# Patient Record
Sex: Female | Born: 1950 | Race: White | Hispanic: No | Smoking: Former smoker
Health system: Southern US, Community
[De-identification: ages and names within clinical notes are randomized; demographics above are authoritative.]

## PROBLEM LIST (undated history)

## (undated) DIAGNOSIS — E669 Obesity, unspecified: Secondary | ICD-10-CM

## (undated) DIAGNOSIS — T7840XA Allergy, unspecified, initial encounter: Secondary | ICD-10-CM

## (undated) DIAGNOSIS — C4490 Unspecified malignant neoplasm of skin, unspecified: Secondary | ICD-10-CM

## (undated) DIAGNOSIS — E871 Hypo-osmolality and hyponatremia: Secondary | ICD-10-CM

## (undated) DIAGNOSIS — J45909 Unspecified asthma, uncomplicated: Secondary | ICD-10-CM

## (undated) DIAGNOSIS — C50919 Malignant neoplasm of unspecified site of unspecified female breast: Secondary | ICD-10-CM

## (undated) DIAGNOSIS — J449 Chronic obstructive pulmonary disease, unspecified: Secondary | ICD-10-CM

## (undated) DIAGNOSIS — E876 Hypokalemia: Secondary | ICD-10-CM

## (undated) DIAGNOSIS — Z923 Personal history of irradiation: Secondary | ICD-10-CM

## (undated) DIAGNOSIS — J9601 Acute respiratory failure with hypoxia: Secondary | ICD-10-CM

## (undated) HISTORY — PX: MOHS SURGERY: SUR867

## (undated) HISTORY — DX: Allergy, unspecified, initial encounter: T78.40XA

## (undated) HISTORY — DX: Personal history of irradiation: Z92.3

---

## 2002-04-09 ENCOUNTER — Ambulatory Visit (HOSPITAL_COMMUNITY): Admission: RE | Admit: 2002-04-09 | Discharge: 2002-04-09 | Payer: Self-pay | Admitting: *Deleted

## 2002-04-09 ENCOUNTER — Encounter: Payer: Self-pay | Admitting: *Deleted

## 2002-05-05 ENCOUNTER — Ambulatory Visit (HOSPITAL_COMMUNITY): Admission: RE | Admit: 2002-05-05 | Discharge: 2002-05-05 | Payer: Self-pay | Admitting: Orthopaedic Surgery

## 2002-05-05 ENCOUNTER — Encounter: Payer: Self-pay | Admitting: Orthopaedic Surgery

## 2004-03-14 ENCOUNTER — Ambulatory Visit: Payer: Self-pay | Admitting: *Deleted

## 2004-04-17 ENCOUNTER — Emergency Department: Payer: Self-pay | Admitting: Emergency Medicine

## 2006-03-29 ENCOUNTER — Emergency Department: Payer: Self-pay | Admitting: Internal Medicine

## 2007-11-17 ENCOUNTER — Inpatient Hospital Stay: Payer: Self-pay | Admitting: *Deleted

## 2007-11-26 ENCOUNTER — Emergency Department: Payer: Self-pay | Admitting: Emergency Medicine

## 2008-04-07 ENCOUNTER — Ambulatory Visit: Payer: Self-pay

## 2013-02-05 DIAGNOSIS — C50919 Malignant neoplasm of unspecified site of unspecified female breast: Secondary | ICD-10-CM

## 2013-02-05 HISTORY — PX: BREAST EXCISIONAL BIOPSY: SUR124

## 2013-02-05 HISTORY — DX: Malignant neoplasm of unspecified site of unspecified female breast: C50.919

## 2013-02-05 HISTORY — PX: BREAST BIOPSY: SHX20

## 2013-07-15 ENCOUNTER — Ambulatory Visit: Payer: Self-pay

## 2013-07-17 ENCOUNTER — Ambulatory Visit: Payer: Self-pay

## 2013-07-20 ENCOUNTER — Ambulatory Visit: Payer: Self-pay

## 2013-07-22 LAB — PATHOLOGY REPORT

## 2013-08-13 ENCOUNTER — Ambulatory Visit: Payer: Self-pay | Admitting: Surgery

## 2013-08-13 LAB — CBC
HCT: 40.5 % (ref 35.0–47.0)
HGB: 13 g/dL (ref 12.0–16.0)
MCH: 28.6 pg (ref 26.0–34.0)
MCHC: 32.2 g/dL (ref 32.0–36.0)
MCV: 89 fL (ref 80–100)
Platelet: 263 10*3/uL (ref 150–440)
RBC: 4.55 10*6/uL (ref 3.80–5.20)
RDW: 13.6 % (ref 11.5–14.5)
WBC: 8.5 10*3/uL (ref 3.6–11.0)

## 2013-08-21 ENCOUNTER — Ambulatory Visit: Payer: Self-pay | Admitting: Surgery

## 2013-08-21 HISTORY — PX: BREAST LUMPECTOMY: SHX2

## 2013-08-26 LAB — PATHOLOGY REPORT

## 2013-09-08 ENCOUNTER — Ambulatory Visit: Payer: Self-pay | Admitting: Oncology

## 2013-09-23 ENCOUNTER — Other Ambulatory Visit: Payer: Self-pay | Admitting: Oncology

## 2013-09-23 DIAGNOSIS — D0502 Lobular carcinoma in situ of left breast: Secondary | ICD-10-CM

## 2013-10-01 ENCOUNTER — Ambulatory Visit
Admission: RE | Admit: 2013-10-01 | Discharge: 2013-10-01 | Disposition: A | Discharge status: Home / Self Care | Payer: Medicaid Other | Source: Ambulatory Visit | Attending: Oncology | Admitting: Oncology

## 2013-10-01 DIAGNOSIS — D0502 Lobular carcinoma in situ of left breast: Secondary | ICD-10-CM

## 2013-10-01 MED ORDER — GADOBENATE DIMEGLUMINE 529 MG/ML IV SOLN
19.0000 mL | Freq: Once | INTRAVENOUS | Status: AC | PRN
  Administered 2013-10-01: 19 mL via INTRAVENOUS

## 2013-10-06 ENCOUNTER — Ambulatory Visit: Payer: Self-pay | Admitting: Oncology

## 2013-11-05 ENCOUNTER — Ambulatory Visit: Payer: Self-pay | Admitting: Oncology

## 2014-05-29 NOTE — Op Note (Signed)
PATIENT NAME:  Marie Fernandez, Marie Fernandez MR#:  [id number] DATE OF BIRTH:  28-Mar-1950  DATE OF PROCEDURE:  08/21/2013  PREOPERATIVE DIAGNOSIS: Left breast mass.   POSTOPERATIVE DIAGNOSIS: Left breast mass.   PROCEDURE: Excision of left breast mass.   SURGEON: Rochel Brome, M.D.   ANESTHESIA: General.   INDICATIONS: This 64 year old female recently had mammogram depicting a cluster of microcalcifications in the lateral aspect left breast. 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 Preoperative x-ray, needle localization and excision of breast mass was recommended for further evaluation and treatment.   DESCRIPTION OF PROCEDURE: The patient did have a preoperative insertion of Kopans wire. These mammogram images were reviewed, demonstrating location of the wire in the lower outer quadrant in the very close proximity of the wire to the biopsy marker and mammogram findings of mass at that site.   The patient was placed on the operating table in the supine position under general anesthesia. The dressing was removed from the left breast exposing the Kopans wire, which entered the breast in the peripheral aspect 8 o'clock position left breast. The wire was cut 2 cm from the skin. The breast was prepared with ChloraPrep and draped in a sterile manner. A curvilinear incision was made from approximately 2 o'clock to 4:30 position of the left breast, carried down through subcutaneous tissues and encountered the wire and dissected down to the beginning of the thick portion of the wire. At this point, grasped some glandular tissue with an Allis clamp and began to widen the dissection and removed a sample of tissue around the thick portion  extending down to the barb. This portion of tissue was approximately 3 x 3 x 3 cm in dimension and was sent with a wire for specimen mammogram and for routine pathology. The wound was  inspected. It is noted that electrocautery was used during the course of the procedure for hemostasis, and it now appeared that the hemostasis was intact. Some of the deeper tissues were infiltrated with 0.5% Sensorcaine with epinephrine and also subcutaneous tissues were infiltrated as well. The  wound was closed with a running 4-0 Monocryl subcuticular suture.   The radiologist did call to indicate that the biopsy marker was seen within the specimen, and the tissues were sent on to the lab for routine pathology. The wound was treated with Dermabond. The patient was prepared for transfer to the recovery room.    ____________________________ Lenna Sciara. Rochel Brome, MD jws:ds D: 08/21/2013 10:50:13 ET T: 08/21/2013 15:13:46 ET JOB#: [id number]  cc: Loreli Dollar, MD, <Dictator> Loreli Dollar MD ELECTRONICALLY SIGNED 08/23/2013 20:58

## 2014-11-29 ENCOUNTER — Other Ambulatory Visit: Payer: Self-pay | Admitting: Preventative Medicine

## 2014-11-29 DIAGNOSIS — Z1239 Encounter for other screening for malignant neoplasm of breast: Secondary | ICD-10-CM

## 2014-11-29 DIAGNOSIS — Z853 Personal history of malignant neoplasm of breast: Secondary | ICD-10-CM

## 2014-12-06 ENCOUNTER — Other Ambulatory Visit: Payer: Self-pay | Admitting: Preventative Medicine

## 2014-12-06 DIAGNOSIS — Z853 Personal history of malignant neoplasm of breast: Secondary | ICD-10-CM

## 2014-12-06 DIAGNOSIS — Z1239 Encounter for other screening for malignant neoplasm of breast: Secondary | ICD-10-CM

## 2014-12-24 ENCOUNTER — Other Ambulatory Visit: Payer: Medicaid Other

## 2014-12-24 ENCOUNTER — Ambulatory Visit: Payer: Medicaid Other | Attending: Preventative Medicine

## 2015-01-13 ENCOUNTER — Telehealth: Payer: Self-pay | Admitting: *Deleted

## 2015-01-13 NOTE — Telephone Encounter (Signed)
Received recertification notice from DSS for patient's Medicaid.  Called and spoke with patient.  States she is not taking the antihormonal pills anymore.  States they made her feel bad.  She also has not had a mammogram in over a year.  States she missed her appointment.  Encouraged her to reschedule and transferred her call to the breast center for an appointment.  Informed her I could not renew her Medicaid since she was not taking her pills, and that we could get her back into our [facility] program next year for her next mammogram.  She is agreeable.

## 2015-01-27 ENCOUNTER — Ambulatory Visit
Admission: RE | Admit: 2015-01-27 | Discharge: 2015-01-27 | Disposition: A | Discharge status: Home / Self Care | Payer: Medicaid Other | Source: Ambulatory Visit | Attending: Preventative Medicine | Admitting: Preventative Medicine

## 2015-01-27 ENCOUNTER — Other Ambulatory Visit: Payer: Self-pay | Admitting: Preventative Medicine

## 2015-01-27 DIAGNOSIS — Z853 Personal history of malignant neoplasm of breast: Secondary | ICD-10-CM | POA: Insufficient documentation

## 2015-01-27 DIAGNOSIS — Z1239 Encounter for other screening for malignant neoplasm of breast: Secondary | ICD-10-CM

## 2015-01-27 DIAGNOSIS — Z9889 Other specified postprocedural states: Secondary | ICD-10-CM | POA: Diagnosis not present

## 2015-01-27 HISTORY — DX: Unspecified malignant neoplasm of skin, unspecified: C44.90

## 2015-01-27 HISTORY — DX: Malignant neoplasm of unspecified site of unspecified female breast: C50.919

## 2015-02-14 ENCOUNTER — Ambulatory Visit: Payer: Medicaid Other | Admitting: Cardiovascular Disease

## 2015-04-04 ENCOUNTER — Ambulatory Visit: Payer: Medicaid Other | Admitting: Cardiovascular Disease

## 2016-11-29 ENCOUNTER — Other Ambulatory Visit: Payer: Self-pay | Admitting: Family Medicine

## 2016-11-29 DIAGNOSIS — Z13820 Encounter for screening for osteoporosis: Secondary | ICD-10-CM

## 2016-11-30 ENCOUNTER — Other Ambulatory Visit: Payer: Self-pay | Admitting: Family Medicine

## 2016-11-30 DIAGNOSIS — Z1239 Encounter for other screening for malignant neoplasm of breast: Secondary | ICD-10-CM

## 2016-11-30 DIAGNOSIS — R42 Dizziness and giddiness: Secondary | ICD-10-CM

## 2016-11-30 DIAGNOSIS — Z853 Personal history of malignant neoplasm of breast: Secondary | ICD-10-CM

## 2016-12-06 ENCOUNTER — Ambulatory Visit
Admission: RE | Admit: 2016-12-06 | Discharge: 2016-12-06 | Disposition: A | Discharge status: Home / Self Care | Payer: Medicare HMO | Source: Ambulatory Visit | Attending: Family Medicine | Admitting: Family Medicine

## 2016-12-06 DIAGNOSIS — M85851 Other specified disorders of bone density and structure, right thigh: Secondary | ICD-10-CM | POA: Insufficient documentation

## 2016-12-06 DIAGNOSIS — M8588 Other specified disorders of bone density and structure, other site: Secondary | ICD-10-CM | POA: Insufficient documentation

## 2016-12-06 DIAGNOSIS — Z13820 Encounter for screening for osteoporosis: Secondary | ICD-10-CM

## 2017-01-31 ENCOUNTER — Encounter: Payer: Self-pay | Admitting: Emergency Medicine

## 2017-01-31 ENCOUNTER — Emergency Department
Admission: EM | Admit: 2017-01-31 | Discharge: 2017-01-31 | Disposition: A | Discharge status: Home / Self Care | Payer: Medicare HMO | Attending: Student in an Organized Health Care Education/Training Program | Admitting: Student in an Organized Health Care Education/Training Program

## 2017-01-31 ENCOUNTER — Emergency Department: Payer: Medicare HMO

## 2017-01-31 DIAGNOSIS — Y999 Unspecified external cause status: Secondary | ICD-10-CM | POA: Diagnosis not present

## 2017-01-31 DIAGNOSIS — Z853 Personal history of malignant neoplasm of breast: Secondary | ICD-10-CM | POA: Diagnosis not present

## 2017-01-31 DIAGNOSIS — J449 Chronic obstructive pulmonary disease, unspecified: Secondary | ICD-10-CM | POA: Diagnosis not present

## 2017-01-31 DIAGNOSIS — Y929 Unspecified place or not applicable: Secondary | ICD-10-CM | POA: Diagnosis not present

## 2017-01-31 DIAGNOSIS — J45909 Unspecified asthma, uncomplicated: Secondary | ICD-10-CM | POA: Diagnosis not present

## 2017-01-31 DIAGNOSIS — X58XXXA Exposure to other specified factors, initial encounter: Secondary | ICD-10-CM | POA: Diagnosis not present

## 2017-01-31 DIAGNOSIS — S8992XA Unspecified injury of left lower leg, initial encounter: Secondary | ICD-10-CM | POA: Diagnosis present

## 2017-01-31 DIAGNOSIS — Y939 Activity, unspecified: Secondary | ICD-10-CM | POA: Diagnosis not present

## 2017-01-31 DIAGNOSIS — S86912A Strain of unspecified muscle(s) and tendon(s) at lower leg level, left leg, initial encounter: Secondary | ICD-10-CM

## 2017-01-31 DIAGNOSIS — Z87891 Personal history of nicotine dependence: Secondary | ICD-10-CM | POA: Insufficient documentation

## 2017-01-31 DIAGNOSIS — M25562 Pain in left knee: Secondary | ICD-10-CM

## 2017-01-31 DIAGNOSIS — Z85828 Personal history of other malignant neoplasm of skin: Secondary | ICD-10-CM | POA: Insufficient documentation

## 2017-01-31 HISTORY — DX: Chronic obstructive pulmonary disease, unspecified: J44.9

## 2017-01-31 HISTORY — DX: Unspecified asthma, uncomplicated: J45.909

## 2017-01-31 MED ORDER — HYDROCODONE-ACETAMINOPHEN 5-325 MG PO TABS
1.0000 | ORAL_TABLET | Freq: Once | ORAL | Status: AC
Start: 2017-01-31 — End: 2017-01-31
  Administered 2017-01-31: 1 via ORAL
  Filled 2017-01-31: qty 1

## 2017-01-31 MED ORDER — NABUMETONE 750 MG PO TABS: 750.0000 mg | ORAL_TABLET | Freq: Two times a day (BID) | ORAL | 0 refills | Status: DC

## 2017-01-31 NOTE — ED Triage Notes (Signed)
Pt c/o left posterior knee pain x1 week, has been seen by PCP and told to take ibuprofen for pain, no x-rays done. Pt denies injury to area. Pt reports she heard a "pop" today and has been unable to stand on affected leg without pain. Swelling noted to area.

## 2017-01-31 NOTE — Discharge Instructions (Signed)
Your exam and x-ray are consistent with a knee sprain. Your x-ray is essentially normal. Take the prescription anti-inflammatory as directed. Wear the knee brace for support while walking. Follow-up with Dr. Sabra Heck or your provider for continued symptoms.

## 2017-01-31 NOTE — ED Provider Notes (Signed)
[facility] Emergency Department Provider Note ____________________________________________  Time seen: 2235  I have reviewed the triage vital signs and the nursing notes.  HISTORY  Chief Complaint  Knee Pain  HPI Marie Fernandez is a 66 y.o. female resents to the ED accompanied by her family for evaluation of posterior left knee pain for the last week.  Patient was apparently evaluated by the PCP and mentioned the pain.  She was advised to dose ibuprofen for pain.  She denies any recent injury, accident, or trauma.  Patient describes she heard a "pop"to the posterior knee while walking with the assitance of crutches she had at home. She has had increasing disability since that time. She reports difficulty extending the knee and bearing weight.   Past Medical History:  Diagnosis Date  . Asthma   . Breast cancer ([place]) 2015   left breast cancer  . COPD (chronic obstructive pulmonary disease) ([place])   . Skin cancer    squammous cell carcinoma    There are no active problems to display for this patient.   Past Surgical History:  Procedure Laterality Date  . BREAST BIOPSY Left 2015   stereo biopsy  . BREAST LUMPECTOMY Left 08/21/2013    Prior to Admission medications   Medication Sig Start Date End Date Taking? Authorizing Provider  nabumetone (RELAFEN) 750 MG tablet Take 1 tablet (750 mg total) by mouth 2 (two) times daily. 01/31/17   Ardis Lawley, Dannielle Karvonen, PA-C   Allergies Patient has no known allergies.  Family History  Problem Relation Age of Onset  . Breast cancer Cousin   . Breast cancer Maternal Grandmother     Social History Social History   Tobacco Use  . Smoking status: Former Research scientist (life sciences)  . Smokeless tobacco: Never Used  Substance Use Topics  . Alcohol use: Not on file  . Drug use: Not on file    Review of Systems  Constitutional: Negative for fever. Cardiovascular: Negative for chest pain. Respiratory: Negative for shortness of  breath. Musculoskeletal: Negative for back pain.  Posterior left knee pain as above. Skin: Negative for rash. Neurological: Negative for headaches, focal weakness or numbness. ____________________________________________  PHYSICAL EXAM:  VITAL SIGNS: ED Triage Vitals  Enc Vitals Group     BP 01/31/17 2217 (!) 167/77     Pulse Rate 01/31/17 2217 86     Resp 01/31/17 2217 18     Temp 01/31/17 2217 98 F (36.7 C)     Temp Source 01/31/17 2217 Oral     SpO2 01/31/17 2217 97 %     Weight 01/31/17 2217 230 lb (104.3 kg)     Height 01/31/17 2217 5\' 3"  (1.6 m)     Head Circumference --      Peak Flow --      Pain Score 01/31/17 2231 10     Pain Loc --      Pain Edu? --      Excl. in [place]? --     Constitutional: Alert and oriented. Well appearing and in no distress. Head: Normocephalic and atraumatic. Cardiovascular: Normal rate, regular rhythm. Normal distal pulses. Respiratory: Normal respiratory effort. No wheezes/rales/rhonchi. Gastrointestinal: Soft and nontender. No distention. Musculoskeletal: Acne without any obvious deformity, dislocation, or effusion.  Patient with normal patellar tracking without ballottement.  No significant valgus or varus joint stress is appreciated.  She also has some tenderness to palpation along the IT band left thigh.  She localizes some pain to the posterior proximal  As well as the distal calf.  She also notes some pain tenderness to the inferior patellar tendon.  No anterior/posterior drawer.  No popliteal space fullness is appreciated.  Negative Lachman's and McMurray's on exam.  Nontender with normal range of motion in all extremities.  Neurologic:  Normal gait without ataxia. Normal speech and language. No gross focal neurologic deficits are appreciated. Skin:  Skin is warm, dry and intact. No rash noted. Psychiatric: Mood and affect are normal. Patient exhibits appropriate insight and judgment. ____________________________________________    RADIOLOGY  Left Knee  IMPRESSION: No evidence of fracture or dislocation.  I, Amoni Morales, Dannielle Karvonen, personally viewed and evaluated these images (plain radiographs) as part of my medical decision making, as well as reviewing the written report by the radiologist. ____________________________________________  PROCEDURES  .Splint Application Date/Time: 41/32/4401 11:41 PM Performed by: Bennie Pierini, EMT Authorized by: Melvenia Needles, PA-C   Consent:    Consent obtained:  Verbal   Consent given by:  Patient   Alternatives discussed:  No treatment Pre-procedure details:    Sensation:  Normal Procedure details:    Laterality:  Left   Location:  Knee   Splint type:  Knee immobilizer   Supplies:  Prefabricated splint Post-procedure details:    Pain:  Improved   Sensation:  Normal   Patient tolerance of procedure:  Tolerated well, no immediate complications  Norco 0-272 mg PO ____________________________________________  INITIAL IMPRESSION / ASSESSMENT AND PLAN / ED COURSE  Patient with ED evaluation of a one-week complaint of posterior left knee pain.  No injury, exam, trauma is been reported.  Patient presents with pain and disability to the left knee.  Her x-ray is negative for any acute fracture or significant degenerative changes.  She is reassured overall by the exam findings as well.  Her exam is consistent more with a knee sprain without any signs of any internal derangement.  She is fitted with a knee immobilizer for support and ambulation.  She is also discharged with a prescription for Relafen to dose as directed.  She will follow-up with her primary care provider or consider orthopedic referral if symptoms persist. ____________________________________________  FINAL CLINICAL IMPRESSION(S) / ED DIAGNOSES  Final diagnoses:  Acute pain of left knee  Strain of left knee, initial encounter      Melvenia Needles, PA-C 01/31/17 2343     Merlyn Lot, MD 02/02/17 [phone]

## 2017-11-13 ENCOUNTER — Emergency Department: Payer: Medicare HMO

## 2017-11-13 ENCOUNTER — Emergency Department
Admission: EM | Admit: 2017-11-13 | Discharge: 2017-11-13 | Disposition: A | Discharge status: Home / Self Care | Payer: Medicare HMO | Attending: Emergency Medicine | Admitting: Emergency Medicine

## 2017-11-13 ENCOUNTER — Encounter: Payer: Self-pay | Admitting: Emergency Medicine

## 2017-11-13 DIAGNOSIS — R0602 Shortness of breath: Secondary | ICD-10-CM | POA: Insufficient documentation

## 2017-11-13 DIAGNOSIS — Z87891 Personal history of nicotine dependence: Secondary | ICD-10-CM | POA: Diagnosis not present

## 2017-11-13 DIAGNOSIS — R000 Tachycardia, unspecified: Secondary | ICD-10-CM | POA: Insufficient documentation

## 2017-11-13 DIAGNOSIS — Z853 Personal history of malignant neoplasm of breast: Secondary | ICD-10-CM | POA: Insufficient documentation

## 2017-11-13 DIAGNOSIS — J449 Chronic obstructive pulmonary disease, unspecified: Secondary | ICD-10-CM | POA: Insufficient documentation

## 2017-11-13 DIAGNOSIS — J40 Bronchitis, not specified as acute or chronic: Secondary | ICD-10-CM | POA: Diagnosis not present

## 2017-11-13 DIAGNOSIS — R0682 Tachypnea, not elsewhere classified: Secondary | ICD-10-CM | POA: Insufficient documentation

## 2017-11-13 DIAGNOSIS — Z85828 Personal history of other malignant neoplasm of skin: Secondary | ICD-10-CM | POA: Insufficient documentation

## 2017-11-13 DIAGNOSIS — R079 Chest pain, unspecified: Secondary | ICD-10-CM | POA: Diagnosis present

## 2017-11-13 DIAGNOSIS — R0789 Other chest pain: Secondary | ICD-10-CM | POA: Insufficient documentation

## 2017-11-13 LAB — CBC
HCT: 40.9 % (ref 36.0–46.0)
Hemoglobin: 13.8 g/dL (ref 12.0–15.0)
MCH: 29.6 pg (ref 26.0–34.0)
MCHC: 33.7 g/dL (ref 30.0–36.0)
MCV: 87.8 fL (ref 80.0–100.0)
NRBC: 0 % (ref 0.0–0.2)
PLATELETS: 220 10*3/uL (ref 150–400)
RBC: 4.66 MIL/uL (ref 3.87–5.11)
RDW: 13.2 % (ref 11.5–15.5)
WBC: 7.3 10*3/uL (ref 4.0–10.5)

## 2017-11-13 LAB — TROPONIN I

## 2017-11-13 LAB — BASIC METABOLIC PANEL
Anion gap: 7 (ref 5–15)
BUN: 11 mg/dL (ref 8–23)
CO2: 28 mmol/L (ref 22–32)
CREATININE: 0.65 mg/dL (ref 0.44–1.00)
Calcium: 8.8 mg/dL — ABNORMAL LOW (ref 8.9–10.3)
Chloride: 107 mmol/L (ref 98–111)
Glucose, Bld: 123 mg/dL — ABNORMAL HIGH (ref 70–99)
Potassium: 4.3 mmol/L (ref 3.5–5.1)
SODIUM: 142 mmol/L (ref 135–145)

## 2017-11-13 MED ORDER — ALBUTEROL SULFATE (2.5 MG/3ML) 0.083% IN NEBU
5.0000 mg | INHALATION_SOLUTION | Freq: Once | RESPIRATORY_TRACT | Status: AC
  Administered 2017-11-13: 5 mg via RESPIRATORY_TRACT
  Filled 2017-11-13: qty 6

## 2017-11-13 MED ORDER — ALBUTEROL SULFATE HFA 108 (90 BASE) MCG/ACT IN AERS: 2.0000 | INHALATION_SPRAY | RESPIRATORY_TRACT | 0 refills | Status: AC | PRN

## 2017-11-13 MED ORDER — IOHEXOL 350 MG/ML SOLN
75.0000 mL | Freq: Once | INTRAVENOUS | Status: AC | PRN
  Administered 2017-11-13: 75 mL via INTRAVENOUS

## 2017-11-13 MED ORDER — ASPIRIN 81 MG PO CHEW
324.0000 mg | CHEWABLE_TABLET | Freq: Once | ORAL | Status: AC
  Administered 2017-11-13: 324 mg via ORAL
  Filled 2017-11-13: qty 4

## 2017-11-13 MED ORDER — KETOROLAC TROMETHAMINE 30 MG/ML IJ SOLN: 30.0000 mg | Freq: Once | INTRAMUSCULAR | Status: DC

## 2017-11-13 MED ORDER — CYCLOBENZAPRINE HCL 5 MG PO TABS: 5.0000 mg | ORAL_TABLET | Freq: Three times a day (TID) | ORAL | 0 refills | Status: DC | PRN

## 2017-11-13 MED ORDER — CYCLOBENZAPRINE HCL 10 MG PO TABS
5.0000 mg | ORAL_TABLET | Freq: Once | ORAL | Status: AC
  Administered 2017-11-13: 5 mg via ORAL
  Filled 2017-11-13: qty 1

## 2017-11-13 NOTE — ED Provider Notes (Signed)
-----------------------------------------   3:36 PM on 11/13/2017 -----------------------------------------  And signed out to me at this time, second troponin is pending, if is not elevated she is to be discharged after extensive work-up for muscular skeletal pain.  Discharge instructions already written by prior physician.  Family and patient aware plan and in agreement according to signout.   Schuyler Amor, MD 11/13/17 1536

## 2017-11-13 NOTE — ED Triage Notes (Signed)
PT arrived with left sided chest pain that radiates to her left arm and jaw. Pt states the pain feels like a stabbing pressure and started 4 days prior. Pt also short of breath in triage, with oxygen saturation of 97%. Pt has HX of COPD/Asthma.

## 2017-11-13 NOTE — ED Notes (Signed)
Dr. Darl Householder at pt's bedside.

## 2017-11-13 NOTE — ED Notes (Signed)
Neb complete.  Patient states chest tightness has improved.  AAOx3.  Skin warm and dry. NAD.  No SOB/ DOE

## 2017-11-13 NOTE — ED Provider Notes (Signed)
[place] EMERGENCY DEPARTMENT Provider Note   CSN: [id number] Arrival date & time: 11/13/17  1208     History   Chief Complaint Chief Complaint  Patient presents with  . Chest Pain    HPI Marie Fernandez is a 67 y.o. female hx of asthma, breast cancer in remission, COPD, here presenting with shortness of breath, left-sided chest pain.  Patient states that she has been having left-sided chest pressure for the last 4 to 5 days.  States that it was intermittent now but woke up this morning and it was constant.  States that his left side of her chest and radiated to the jaw and her left arm and associated with some shortness of breath as well.  Patient was noted to be tachypneic and borderline tachycardic in triage.  Patient was given 1 albuterol neb prior to my exam.  She states that she does have a history of COPD and does not smoke currently.  She denies any recent travel history of blood clots or coronary artery disease.   The history is provided by the patient.    Past Medical History:  Diagnosis Date  . Asthma   . Breast cancer ([place]) 2015   left breast cancer  . COPD (chronic obstructive pulmonary disease) ([place])   . Skin cancer    squammous cell carcinoma    There are no active problems to display for this patient.   Past Surgical History:  Procedure Laterality Date  . BREAST BIOPSY Left 2015   stereo biopsy  . BREAST LUMPECTOMY Left 08/21/2013     OB History   None      Home Medications    Prior to Admission medications   Medication Sig Start Date End Date Taking? Authorizing Provider  nabumetone (RELAFEN) 750 MG tablet Take 1 tablet (750 mg total) by mouth 2 (two) times daily. 01/31/17   Menshew, Dannielle Karvonen, PA-C    Family History Family History  Problem Relation Age of Onset  . Breast cancer Cousin   . Breast cancer Maternal Grandmother     Social History Social History   Tobacco Use  . Smoking status: Former Research scientist (life sciences)  .  Smokeless tobacco: Never Used  Substance Use Topics  . Alcohol use: Not on file  . Drug use: Not on file     Allergies   Patient has no known allergies.   Review of Systems Review of Systems  Respiratory: Positive for shortness of breath.   Cardiovascular: Positive for chest pain.  All other systems reviewed and are negative.    Physical Exam Updated Vital Signs BP (!) 151/68 (BP Location: Right Arm)   Pulse 93   Temp (!) 97.5 F (36.4 C) (Oral)   Resp (!) 24   Ht 5\' 3"  (1.6 m)   Wt 101.2 kg   SpO2 97%   BMI 39.50 kg/m   Physical Exam  Constitutional: She is oriented to person, place, and time. She appears well-developed and well-nourished.  HENT:  Head: Normocephalic.  Eyes: Pupils are equal, round, and reactive to light. EOM are normal.  Neck: Normal range of motion.  Cardiovascular: Regular rhythm and normal pulses.  Pulmonary/Chest: Effort normal and breath sounds normal.  Reproducible L chest wall tenderness. No wheezing or retractions   Abdominal: Soft. Bowel sounds are normal.  Musculoskeletal: Normal range of motion.       Right lower leg: Normal.       Left lower leg: Normal.  Neurological: She  is alert and oriented to person, place, and time.  Skin: Skin is warm. Capillary refill takes less than 2 seconds.  Psychiatric: She has a normal mood and affect. Her behavior is normal.  Nursing note and vitals reviewed.    ED Treatments / Results  Labs (all labs ordered are listed, but only abnormal results are displayed) Labs Reviewed  BASIC METABOLIC PANEL - Abnormal; Notable for the following components:      Result Value   Glucose, Bld 123 (*)    Calcium 8.8 (*)    All other components within normal limits  CBC  TROPONIN I    EKG EKG Interpretation  Date/Time:  Wednesday November 13 2017 12:15:25 EDT Ventricular Rate:  100 PR Interval:  130 QRS Duration: 70 QT Interval:  352 QTC Calculation: 454 R Axis:   76 Text Interpretation:  Normal  sinus rhythm Nonspecific ST abnormality Abnormal ECG When compared with ECG of 13-Aug-2013 13:45, No significant change was found Confirmed by Wandra Arthurs [phone]) on 11/13/2017 1:00:52 PM   Radiology No results found.  Procedures Procedures (including critical care time)  Medications Ordered in ED Medications  cyclobenzaprine (FLEXERIL) tablet 5 mg (has no administration in time range)  aspirin chewable tablet 324 mg (has no administration in time range)  albuterol (PROVENTIL) (2.5 MG/3ML) 0.083% nebulizer solution 5 mg (5 mg Nebulization Given 11/13/17 1219)     Initial Impression / Assessment and Plan / ED Course  I have reviewed the triage vital signs and the nursing notes.  Pertinent labs & imaging results that were available during my care of the patient were reviewed by me and considered in my medical decision making (see chart for details).    Marie Fernandez is a 67 y.o. female here with chest pain, SOB. Has breast cancer in the past. Heart rate around 100, was noted to be tachypneic in triage. No wheezing on my exam. Will get CTA chest to r/o PE. Will get trop x 2 but I think likely chest wall pain. Will give flexeril, ASA.   3:28 PM Pain free after flexeril. Labs unremarkable. CTA showed no PE. Repeat troponin pending. If negative, anticipate dc home with flexeril. Signed out to Dr. Burlene Arnt.   Final Clinical Impressions(s) / ED Diagnoses   Final diagnoses:  None    ED Discharge Orders    None       Drenda Freeze, MD 11/13/17 1530

## 2017-11-13 NOTE — Discharge Instructions (Addendum)
Take motrin for pain.   Take flexeril for muscle spasms.   Use albuterol as needed for cough or congestion   See your doctor  Return to ER if you have worse chest pain, trouble breathing, cough, fever

## 2017-12-04 ENCOUNTER — Other Ambulatory Visit: Payer: Self-pay

## 2017-12-04 DIAGNOSIS — Z1211 Encounter for screening for malignant neoplasm of colon: Secondary | ICD-10-CM

## 2017-12-23 ENCOUNTER — Encounter: Payer: Self-pay | Admitting: Emergency Medicine

## 2017-12-24 ENCOUNTER — Other Ambulatory Visit: Payer: Self-pay | Admitting: Family Medicine

## 2017-12-24 ENCOUNTER — Encounter
Admission: RE | Disposition: A | Discharge status: Home / Self Care | Payer: Self-pay | Source: Ambulatory Visit | Attending: Gastroenterology

## 2017-12-24 ENCOUNTER — Encounter: Payer: Self-pay | Admitting: Certified Registered Nurse Anesthetist

## 2017-12-24 ENCOUNTER — Ambulatory Visit
Admission: RE | Admit: 2017-12-24 | Discharge: 2017-12-24 | Disposition: A | Discharge status: Home / Self Care | Payer: Medicare HMO | Source: Ambulatory Visit | Attending: Gastroenterology | Admitting: Gastroenterology

## 2017-12-24 ENCOUNTER — Ambulatory Visit: Payer: Medicare HMO | Admitting: Certified Registered Nurse Anesthetist

## 2017-12-24 DIAGNOSIS — J449 Chronic obstructive pulmonary disease, unspecified: Secondary | ICD-10-CM | POA: Diagnosis not present

## 2017-12-24 DIAGNOSIS — Z853 Personal history of malignant neoplasm of breast: Secondary | ICD-10-CM | POA: Insufficient documentation

## 2017-12-24 DIAGNOSIS — D124 Benign neoplasm of descending colon: Secondary | ICD-10-CM | POA: Diagnosis not present

## 2017-12-24 DIAGNOSIS — D122 Benign neoplasm of ascending colon: Secondary | ICD-10-CM

## 2017-12-24 DIAGNOSIS — Z1211 Encounter for screening for malignant neoplasm of colon: Secondary | ICD-10-CM | POA: Diagnosis not present

## 2017-12-24 DIAGNOSIS — D125 Benign neoplasm of sigmoid colon: Secondary | ICD-10-CM | POA: Diagnosis not present

## 2017-12-24 DIAGNOSIS — Z79899 Other long term (current) drug therapy: Secondary | ICD-10-CM | POA: Diagnosis not present

## 2017-12-24 DIAGNOSIS — Z1231 Encounter for screening mammogram for malignant neoplasm of breast: Secondary | ICD-10-CM

## 2017-12-24 DIAGNOSIS — K621 Rectal polyp: Secondary | ICD-10-CM

## 2017-12-24 DIAGNOSIS — Z85828 Personal history of other malignant neoplasm of skin: Secondary | ICD-10-CM | POA: Diagnosis not present

## 2017-12-24 DIAGNOSIS — Z87891 Personal history of nicotine dependence: Secondary | ICD-10-CM | POA: Diagnosis not present

## 2017-12-24 DIAGNOSIS — D128 Benign neoplasm of rectum: Secondary | ICD-10-CM | POA: Diagnosis not present

## 2017-12-24 HISTORY — PX: COLONOSCOPY WITH PROPOFOL: SHX5780

## 2017-12-24 SURGERY — COLONOSCOPY WITH PROPOFOL
Anesthesia: General

## 2017-12-24 MED ORDER — PROPOFOL 10 MG/ML IV BOLUS
INTRAVENOUS | Status: DC | PRN
  Administered 2017-12-24: 60 mg via INTRAVENOUS
  Administered 2017-12-24: 30 mg via INTRAVENOUS

## 2017-12-24 MED ORDER — PROPOFOL 500 MG/50ML IV EMUL
INTRAVENOUS | Status: AC
  Filled 2017-12-24: qty 50

## 2017-12-24 MED ORDER — SODIUM CHLORIDE (PF) 0.9 % IJ SOLN
INTRAMUSCULAR | Status: DC | PRN
  Administered 2017-12-24: 9 mL via INTRAVENOUS

## 2017-12-24 MED ORDER — PROPOFOL 500 MG/50ML IV EMUL
INTRAVENOUS | Status: DC | PRN
  Administered 2017-12-24: 175 ug/kg/min via INTRAVENOUS

## 2017-12-24 MED ORDER — PHENYLEPHRINE HCL 10 MG/ML IJ SOLN
INTRAMUSCULAR | Status: DC | PRN
  Administered 2017-12-24 (×3): 100 ug via INTRAVENOUS

## 2017-12-24 MED ORDER — LIDOCAINE HCL (CARDIAC) PF 100 MG/5ML IV SOSY
PREFILLED_SYRINGE | INTRAVENOUS | Status: DC | PRN
  Administered 2017-12-24: 50 mg via INTRAVENOUS

## 2017-12-24 MED ORDER — SODIUM CHLORIDE 0.9 % IV SOLN
INTRAVENOUS | Status: DC
  Administered 2017-12-24: 1000 mL via INTRAVENOUS

## 2017-12-24 MED ORDER — LIDOCAINE HCL (PF) 1 % IJ SOLN
2.0000 mL | Freq: Once | INTRAMUSCULAR | Status: AC
  Administered 2017-12-24: 0.3 mL via INTRADERMAL

## 2017-12-24 MED ORDER — LIDOCAINE HCL (PF) 2 % IJ SOLN
INTRAMUSCULAR | Status: AC
  Filled 2017-12-24: qty 10

## 2017-12-24 MED ORDER — LIDOCAINE HCL (PF) 1 % IJ SOLN
INTRAMUSCULAR | Status: AC
  Administered 2017-12-24: 0.3 mL via INTRADERMAL
  Filled 2017-12-24: qty 2

## 2017-12-24 NOTE — Anesthesia Post-op Follow-up Note (Signed)
Anesthesia QCDR form completed.        

## 2017-12-24 NOTE — H&P (Signed)
Jonathon Bellows, MD [address] [place], [place], [place], [address] [address], [place], [place], [place], [address] Phone: [phone]  Fax: [phone]  Primary Care Physician:  Cephus Richer, MD   Pre-Procedure History & Physical: HPI:  Marie Fernandez is a 67 y.o. female is here for an colonoscopy.   Past Medical History:  Diagnosis Date  . Asthma   . Breast cancer ([place]) 2015   left breast cancer  . COPD (chronic obstructive pulmonary disease) ([place])   . Skin cancer    squammous cell carcinoma    Past Surgical History:  Procedure Laterality Date  . BREAST BIOPSY Left 2015   stereo biopsy  . BREAST LUMPECTOMY Left 08/21/2013    Prior to Admission medications   Medication Sig Start Date End Date Taking? Authorizing Provider  fluticasone-salmeterol (ADVAIR HFA) 115-21 MCG/ACT inhaler Inhale 2 puffs into the lungs 2 (two) times daily.   Yes [provider]  albuterol (PROVENTIL HFA;VENTOLIN HFA) 108 (90 Base) MCG/ACT inhaler Inhale 2 puffs into the lungs every 2 (two) hours as needed for wheezing or shortness of breath (cough). 11/13/17   Drenda Freeze, MD  cyclobenzaprine (FLEXERIL) 5 MG tablet Take 1 tablet (5 mg total) by mouth 3 (three) times daily as needed for muscle spasms. 11/13/17   Drenda Freeze, MD  nabumetone (RELAFEN) 750 MG tablet Take 1 tablet (750 mg total) by mouth 2 (two) times daily. 01/31/17   Menshew, Dannielle Karvonen, PA-C    Allergies as of 12/04/2017  . (No Known Allergies)    Family History  Problem Relation Age of Onset  . Breast cancer Cousin   . Breast cancer Maternal Grandmother     Social History   Socioeconomic History  . Marital status: Widowed    Spouse name: Not on file  . Number of children: Not on file  . Years of education: Not on file  . Highest education level: Not on file  Occupational History  . Not on file  Social Needs  . Financial resource strain: Not on file  . Food insecurity:    Worry:  Not on file    Inability: Not on file  . Transportation needs:    Medical: Not on file    Non-medical: Not on file  Tobacco Use  . Smoking status: Former Research scientist (life sciences)  . Smokeless tobacco: Never Used  Substance and Sexual Activity  . Alcohol use: Not on file  . Drug use: Not on file  . Sexual activity: Not on file  Lifestyle  . Physical activity:    Days per week: Not on file    Minutes per session: Not on file  . Stress: Not on file  Relationships  . Social connections:    Talks on phone: Not on file    Gets together: Not on file    Attends religious service: Not on file    Active member of club or organization: Not on file    Attends meetings of clubs or organizations: Not on file    Relationship status: Not on file  . Intimate partner violence:    Fear of current or ex partner: Not on file    Emotionally abused: Not on file    Physically abused: Not on file    Forced sexual activity: Not on file  Other Topics Concern  . Not on file  Social History Narrative  . Not on file    Review of Systems: See HPI,  otherwise negative ROS  Physical Exam: BP 126/80   Pulse 91   Temp (!) 97.5 F (36.4 C) (Tympanic)   Resp 16   Ht 5\' 3"  (1.6 m)   Wt 101.2 kg   SpO2 96%   BMI 39.52 kg/m  General:   Alert,  pleasant and cooperative in NAD Head:  Normocephalic and atraumatic. Neck:  Supple; no masses or thyromegaly. Lungs:  Clear throughout to auscultation, normal respiratory effort.    Heart:  +S1, +S2, Regular rate and rhythm, No edema. Abdomen:  Soft, nontender and nondistended. Normal bowel sounds, without guarding, and without rebound.   Neurologic:  Alert and  oriented x4;  grossly normal neurologically.  Impression/Plan: Marie Fernandez is here for an colonoscopy to be performed for Screening colonoscopy average risk   Risks, benefits, limitations, and alternatives regarding  colonoscopy have been reviewed with the patient.  Questions have been answered.  All parties  agreeable.   Jonathon Bellows, MD  12/24/2017, 8:02 AM

## 2017-12-24 NOTE — Op Note (Addendum)
[facility] Gastroenterology Patient Name: Marie Fernandez Procedure Date: 12/24/2017 8:02 AM MRN: [id number] Account #: [id number] Date of Birth: Mar 22, 1950 Admit Type: Outpatient Age: 67 Room: [facility] ENDO ROOM 1 Gender: Female Note Status: Finalized Procedure:            Colonoscopy Indications:          Screening for colorectal malignant neoplasm Providers:            Jonathon Bellows MD, MD Referring MD:         Benny Lennert (Referring MD) Medicines:            Monitored Anesthesia Care Complications:        No immediate complications. Procedure:            Pre-Anesthesia Assessment:                       - Prior to the procedure, a History and Physical was                        performed, and patient medications, allergies and                        sensitivities were reviewed. The patient's tolerance of                        previous anesthesia was reviewed.                       - The risks and benefits of the procedure and the                        sedation options and risks were discussed with the                        patient. All questions were answered and informed                        consent was obtained.                       - ASA Grade Assessment: III - A patient with severe                        systemic disease.                       After obtaining informed consent, the colonoscope was                        passed under direct vision. Throughout the procedure,                        the patient's blood pressure, pulse, and oxygen                        saturations were monitored continuously. The                        Colonoscope was introduced through the anus and  advanced to the the cecum, identified by the                        appendiceal orifice, IC valve and transillumination.                        The colonoscopy was performed with ease. The patient                        tolerated the procedure well. The quality of  the bowel                        preparation was good. Findings:      A 6 mm polyp was found in the proximal ascending colon. The polyp was       sessile. The polyp was removed with a cold snare. Resection and       retrieval were complete.      A 15 mm polyp was found in the rectum. The polyp was semi-sessile.       Preparations were made for mucosal resection. Saline was injected to       raise the lesion. Snare mucosal resection was performed. Resection and       retrieval were complete.      A 18 mm polyp was found in the sigmoid colon. The polyp was       pedunculated. The polyp was removed with a hot snare. Resection and       retrieval were complete. To prevent bleeding after the polypectomy, two       hemostatic clips were successfully placed. There was no bleeding during,       or at the end, of the procedure.      A 10 mm polyp was found in the proximal sigmoid colon. The polyp was       sessile. The polyp was removed with a hot snare. Resection and retrieval       were complete. To prevent bleeding after the polypectomy, two hemostatic       clips were successfully placed. There was no bleeding during, or at the       end, of the procedure.      A 6 mm polyp was found in the sigmoid colon. The polyp was sessile. The       polyp was removed with a cold snare. Resection and retrieval were       complete.      Two sessile polyps were found in the ascending colon. The polyps were 6       to 8 mm in size. These polyps were removed with a hot snare. Resection       and retrieval were complete.      A 18 mm polyp was found in the ascending colon. The polyp was sessile.       Preparations were made for mucosal resection. 9 mL of Eleview was       injected with adequate lift of the lesion from the muscularis propria.       Snare mucosal resection with Jabier Mutton net retrieval was performed. A 20 mm       area was resected. A large area was resected. Resection and retrieval       were  complete. There was no bleeding during and at the end of the       procedure.  To prevent bleeding after mucosal resection, three hemostatic       clips were successfully placed.      The exam was otherwise without abnormality on direct and retroflexion       views. Impression:           - One 6 mm polyp in the proximal ascending colon,                        removed with a cold snare. Resected and retrieved.                       - One 15 mm polyp in the rectum, removed with mucosal                        resection. Resected and retrieved.                       - One 18 mm polyp in the sigmoid colon, removed with a                        hot snare. Resected and retrieved. Clips were placed.                       - One 10 mm polyp in the proximal sigmoid colon,                        removed with a hot snare. Resected and retrieved. Clips                        were placed.                       - One 6 mm polyp in the sigmoid colon, removed with a                        cold snare. Resected and retrieved.                       - Two 6 to 8 mm polyps in the ascending colon, removed                        with a hot snare. Resected and retrieved.                       - One 18 mm polyp in the ascending colon, removed with                        mucosal resection. Resected and retrieved. Clips were                        placed.                       - The examination was otherwise normal on direct and                        retroflexion views.                       - Mucosal resection  was performed. Resection and                        retrieval were complete.                       - Mucosal resection was performed. Resection and                        retrieval were complete. Recommendation:       - Discharge patient to home (with escort).                       - Resume previous diet.                       - Continue present medications.                       - Await pathology results.                        - No NSAID's for 6 weeks                       Repeat colonoscopy in 6 months as there were numerous                        polyps and a few small ones may be missed Procedure Code(s):    --- Professional ---                       [id number], 59, Colonoscopy, flexible; with endoscopic                        mucosal resection                       45385, Colonoscopy, flexible; with removal of tumor(s),                        polyp(s), or other lesion(s) by snare technique Diagnosis Code(s):    --- Professional ---                       Z12.11, Encounter for screening for malignant neoplasm                        of colon                       K62.1, Rectal polyp                       D12.5, Benign neoplasm of sigmoid colon                       D12.2, Benign neoplasm of ascending colon CPT copyright 2018 American Medical Association. All rights reserved. The codes documented in this report are preliminary and upon coder review may  be revised to meet current compliance requirements. Jonathon Bellows, MD Jonathon Bellows MD, MD 12/24/2017 9:11:44 AM This report has been signed electronically. Number of Addenda: 0 Note Initiated On: 12/24/2017 8:02 AM Scope Withdrawal Time: 0 hours 43 minutes 22 seconds  Total Procedure  Duration: 0 hours 45 minutes 18 seconds       [facility]

## 2017-12-24 NOTE — Transfer of Care (Signed)
Immediate Anesthesia Transfer of Care Note  Patient: Marie Fernandez  Procedure(s) Performed: COLONOSCOPY WITH PROPOFOL (N/A )  Patient Location: PACU  Anesthesia Type:General  Level of Consciousness: awake, alert  and oriented  Airway & Oxygen Therapy: Patient Spontanous Breathing  Post-op Assessment: Report given to RN and Post -op Vital signs reviewed and stable  Post vital signs: Reviewed and stable  Last Vitals:  Vitals Value Taken Time  BP 105/81 12/24/2017  9:13 AM  Temp 36.1 C 12/24/2017  9:13 AM  Pulse 81 12/24/2017  9:13 AM  Resp 16 12/24/2017  9:13 AM  SpO2 97 % 12/24/2017  9:13 AM    Last Pain:  Vitals:   12/24/17 0913  TempSrc: Temporal  PainSc:          Complications: No apparent anesthesia complications

## 2017-12-24 NOTE — Anesthesia Procedure Notes (Signed)
Date/Time: 12/24/2017 8:15 AM Performed by: Johnna Acosta, CRNA Pre-anesthesia Checklist: Patient identified, Emergency Drugs available, Suction available, Patient being monitored and Timeout performed Patient Re-evaluated:Patient Re-evaluated prior to induction Oxygen Delivery Method: Nasal cannula Preoxygenation: Pre-oxygenation with 100% oxygen

## 2017-12-24 NOTE — Anesthesia Preprocedure Evaluation (Addendum)
Anesthesia Evaluation  Patient identified by MRN, date of birth, ID band Patient awake    Reviewed: Allergy & Precautions, H&P , NPO status , Patient's Chart, lab work & pertinent test results  Airway Mallampati: III       Dental  (+) Chipped, Poor Dentition   Pulmonary neg pulmonary ROS, asthma , COPD, neg recent URI, former smoker,           Cardiovascular negative cardio ROS       Neuro/Psych negative neurological ROS  negative psych ROS   GI/Hepatic negative GI ROS, Neg liver ROS,   Endo/Other  negative endocrine ROS  Renal/GU negative Renal ROS  negative genitourinary   Musculoskeletal   Abdominal   Peds  Hematology negative hematology ROS (+)   Anesthesia Other Findings Past Medical History: No date: Asthma 2015: Breast cancer ([place])     Comment:  left breast cancer No date: COPD (chronic obstructive pulmonary disease) ([place]) No date: Skin cancer     Comment:  squammous cell carcinoma  Past Surgical History: 2015: BREAST BIOPSY; Left     Comment:  stereo biopsy 08/21/2013: BREAST LUMPECTOMY; Left     Reproductive/Obstetrics negative OB ROS                           Anesthesia Physical Anesthesia Plan  ASA: III  Anesthesia Plan: General   Post-op Pain Management:    Induction:   PONV Risk Score and Plan: Propofol infusion and TIVA  Airway Management Planned: Natural Airway and Nasal Cannula  Additional Equipment:   Intra-op Plan:   Post-operative Plan:   Informed Consent: I have reviewed the patients History and Physical, chart, labs and discussed the procedure including the risks, benefits and alternatives for the proposed anesthesia with the patient or authorized representative who has indicated his/her understanding and acceptance.   Dental Advisory Given  Plan Discussed with: Anesthesiologist  Anesthesia Plan Comments:        Anesthesia Quick  Evaluation

## 2017-12-25 ENCOUNTER — Encounter: Payer: Self-pay | Admitting: Gastroenterology

## 2017-12-25 ENCOUNTER — Ambulatory Visit
Admission: RE | Admit: 2017-12-25 | Discharge: 2017-12-25 | Disposition: A | Discharge status: Home / Self Care | Payer: Medicare HMO | Source: Ambulatory Visit | Attending: Family Medicine | Admitting: Family Medicine

## 2017-12-25 DIAGNOSIS — Z1231 Encounter for screening mammogram for malignant neoplasm of breast: Secondary | ICD-10-CM | POA: Insufficient documentation

## 2017-12-25 LAB — SURGICAL PATHOLOGY

## 2017-12-25 NOTE — Anesthesia Postprocedure Evaluation (Signed)
Anesthesia Post Note  Patient: Marie Fernandez  Procedure(s) Performed: COLONOSCOPY WITH PROPOFOL (N/A )  Patient location during evaluation: PACU Anesthesia Type: General Level of consciousness: awake and alert Pain management: pain level controlled Vital Signs Assessment: post-procedure vital signs reviewed and stable Respiratory status: spontaneous breathing, nonlabored ventilation and respiratory function stable Cardiovascular status: blood pressure returned to baseline and stable Postop Assessment: no apparent nausea or vomiting Anesthetic complications: no     Last Vitals:  Vitals:   12/24/17 0930 12/24/17 0940  BP: 113/87 106/68  Pulse: 71 72  Resp: 18 (!) 25  Temp:    SpO2: 97% 98%    Last Pain:  Vitals:   12/25/17 0727  TempSrc:   PainSc: 0-No pain                 Durenda Hurt

## 2017-12-30 ENCOUNTER — Other Ambulatory Visit: Payer: Self-pay | Admitting: Family Medicine

## 2017-12-30 ENCOUNTER — Encounter: Payer: Self-pay | Admitting: Gastroenterology

## 2017-12-30 DIAGNOSIS — N6320 Unspecified lump in the left breast, unspecified quadrant: Secondary | ICD-10-CM

## 2017-12-30 DIAGNOSIS — R928 Other abnormal and inconclusive findings on diagnostic imaging of breast: Secondary | ICD-10-CM

## 2018-01-09 ENCOUNTER — Ambulatory Visit
Admission: RE | Admit: 2018-01-09 | Discharge: 2018-01-09 | Disposition: A | Discharge status: Home / Self Care | Payer: Medicare HMO | Source: Ambulatory Visit | Attending: Family Medicine | Admitting: Family Medicine

## 2018-01-09 DIAGNOSIS — R928 Other abnormal and inconclusive findings on diagnostic imaging of breast: Secondary | ICD-10-CM | POA: Diagnosis not present

## 2018-01-09 DIAGNOSIS — N6320 Unspecified lump in the left breast, unspecified quadrant: Secondary | ICD-10-CM | POA: Insufficient documentation

## 2018-07-23 ENCOUNTER — Other Ambulatory Visit: Payer: Self-pay | Admitting: Family Medicine

## 2018-07-23 ENCOUNTER — Encounter: Payer: Self-pay | Admitting: Family Medicine

## 2018-07-23 DIAGNOSIS — N6320 Unspecified lump in the left breast, unspecified quadrant: Secondary | ICD-10-CM

## 2018-09-03 ENCOUNTER — Encounter: Payer: Self-pay | Admitting: *Deleted

## 2019-09-21 ENCOUNTER — Ambulatory Visit: Payer: Medicare HMO | Admitting: Dermatology

## 2019-09-21 ENCOUNTER — Other Ambulatory Visit: Payer: Self-pay

## 2019-09-21 DIAGNOSIS — L578 Other skin changes due to chronic exposure to nonionizing radiation: Secondary | ICD-10-CM | POA: Diagnosis not present

## 2019-09-21 DIAGNOSIS — D485 Neoplasm of uncertain behavior of skin: Secondary | ICD-10-CM

## 2019-09-21 DIAGNOSIS — C44311 Basal cell carcinoma of skin of nose: Secondary | ICD-10-CM

## 2019-09-21 DIAGNOSIS — C44321 Squamous cell carcinoma of skin of nose: Secondary | ICD-10-CM

## 2019-09-21 DIAGNOSIS — Z85828 Personal history of other malignant neoplasm of skin: Secondary | ICD-10-CM

## 2019-09-21 NOTE — Patient Instructions (Addendum)

## 2019-09-21 NOTE — Progress Notes (Signed)
   New Patient Visit  Subjective  Marie Fernandez is a 69 y.o. female who presents for the following: lesion (on the nose for 1.5 years - crusted, scabbed, patient has a hx of skin CA but she is unsure if it was [facility] or SCC).  The following portions of the chart were reviewed this encounter and updated as appropriate:  Tobacco  Allergies  Meds  Problems  Med Hx  Surg Hx  Fam Hx     Review of Systems:  No other skin or systemic complaints except as noted in HPI or Assessment and Plan.  Objective  Well appearing patient in no apparent distress; mood and affect are within normal limits.  A focused examination was performed including the face. Relevant physical exam findings are noted in the Assessment and Plan.  Objective  R distal dorsum nose: 1.5 cm crusted plaque     Objective  R hand: Well healed scar   Images    Assessment & Plan  Neoplasm of uncertain behavior of skin R distal dorsum nose  Skin / nail biopsy Type of biopsy: tangential   Informed consent: discussed and consent obtained   Timeout: patient name, date of birth, surgical site, and procedure verified   Procedure prep:  Patient was prepped and draped in usual sterile fashion Prep type:  Isopropyl alcohol Anesthesia: the lesion was anesthetized in a standard fashion   Anesthetic:  1% lidocaine w/ epinephrine 1-100,000 buffered w/ 8.4% NaHCO3 Instrument used: flexible razor blade   Hemostasis achieved with: pressure, aluminum chloride and electrodesiccation   Outcome: patient tolerated procedure well   Post-procedure details: sterile dressing applied and wound care instructions given   Dressing type: bandage and petrolatum    Specimen 1 - Surgical pathology Differential Diagnosis: D48.5 r/o BCC vs SCC  Check Margins: No 1.5 cm crusted plaque  Recommend MOHS if pos. for CA  History of skin cancer R hand  Clear. Observe for recurrence. Call clinic for new or changing lesions.  Recommend regular  skin exams, daily broad-spectrum spf 30+ sunscreen use, and photoprotection.       Actinic Damage - diffuse scaly erythematous macules with underlying dyspigmentation - Recommend daily broad spectrum sunscreen SPF 30+ to sun-exposed areas, reapply every 2 hours as needed.  - Call for new or changing lesions.   Return if symptoms worsen or fail to improve.  Luther Redo, CMA, am acting as scribe for Sarina Ser, MD .  Documentation: I have reviewed the above documentation for accuracy and completeness, and I agree with the above.  Sarina Ser, MD

## 2019-09-24 ENCOUNTER — Encounter: Payer: Self-pay | Admitting: Dermatology

## 2019-09-30 ENCOUNTER — Telehealth: Payer: Self-pay

## 2019-09-30 DIAGNOSIS — C4499 Other specified malignant neoplasm of skin, unspecified: Secondary | ICD-10-CM

## 2019-09-30 NOTE — Telephone Encounter (Signed)
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

## 2019-09-30 NOTE — Telephone Encounter (Signed)
Patient informed of pathology results. Referral faxed to [facility] in [place], [place].

## 2019-10-14 ENCOUNTER — Other Ambulatory Visit: Payer: Self-pay

## 2019-10-14 ENCOUNTER — Other Ambulatory Visit: Payer: Medicare HMO

## 2019-10-14 DIAGNOSIS — Z20822 Contact with and (suspected) exposure to covid-19: Secondary | ICD-10-CM

## 2019-10-15 ENCOUNTER — Telehealth: Payer: Self-pay

## 2019-10-15 NOTE — Telephone Encounter (Signed)
Pt. Checking on COVID 19 results, not available yet. °

## 2019-10-16 LAB — SARS-COV-2, NAA 2 DAY TAT

## 2019-10-16 LAB — NOVEL CORONAVIRUS, NAA: SARS-CoV-2, NAA: NOT DETECTED

## 2020-06-09 ENCOUNTER — Telehealth (INDEPENDENT_AMBULATORY_CARE_PROVIDER_SITE_OTHER): Payer: Self-pay | Admitting: Gastroenterology

## 2020-06-09 ENCOUNTER — Other Ambulatory Visit: Payer: Self-pay

## 2020-06-09 DIAGNOSIS — Z86010 Personal history of colonic polyps: Secondary | ICD-10-CM

## 2020-06-09 MED ORDER — NA SULFATE-K SULFATE-MG SULF 17.5-3.13-1.6 GM/177ML PO SOLN: 1.0000 | Freq: Once | ORAL | 0 refills | Status: AC

## 2020-06-09 NOTE — Progress Notes (Signed)
Gastroenterology Pre-Procedure Review  Request Date: 06/20/20 Requesting Physician: Dr. Vicente Males  PATIENT REVIEW QUESTIONS: The patient responded to the following health history questions as indicated:    1. Are you having any GI issues? no 2. Do you have a personal history of Polyps? yes (12/24/2017 colonoscopy performed by Dr. Vicente Males) 3. Do you have a family history of Colon Cancer or Polyps? no 4. Diabetes Mellitus? no 5. Joint replacements in the past 12 months?no 6. Major health problems in the past 3 months?no major health problems skin cancer removed from nose October 2021 7. Any artificial heart valves, MVP, or defibrillator?no    MEDICATIONS & ALLERGIES:    Patient reports the following regarding taking any anticoagulation/antiplatelet therapy:   Plavix, Coumadin, Eliquis, Xarelto, Lovenox, Pradaxa, Brilinta, or Effient? no Aspirin? no  Patient confirms/reports the following medications:  Current Outpatient Medications  Medication Sig Dispense Refill  . albuterol (PROVENTIL HFA;VENTOLIN HFA) 108 (90 Base) MCG/ACT inhaler Inhale 2 puffs into the lungs every 2 (two) hours as needed for wheezing or shortness of breath (cough). 1 Inhaler 0  . ATROVENT HFA 17 MCG/ACT inhaler Inhale 2 puffs into the lungs 4 (four) times daily.    . citalopram (CELEXA) 40 MG tablet Take 40 mg by mouth daily.    . fluticasone (FLONASE) 50 MCG/ACT nasal spray SMARTSIG:2 Spray(s) Both Nares Daily PRN    . fluticasone-salmeterol (ADVAIR HFA) 115-21 MCG/ACT inhaler Inhale 2 puffs into the lungs 2 (two) times daily.    . hydrOXYzine (ATARAX/VISTARIL) 50 MG tablet Take 50 mg by mouth at bedtime as needed.    . montelukast (SINGULAIR) 10 MG tablet Take 1 tablet by mouth at bedtime.    . cyclobenzaprine (FLEXERIL) 5 MG tablet Take 1 tablet (5 mg total) by mouth 3 (three) times daily as needed for muscle spasms. (Patient not taking: Reported on 06/09/2020) 10 tablet 0  . nabumetone (RELAFEN) 750 MG tablet Take 1  tablet (750 mg total) by mouth 2 (two) times daily. (Patient not taking: Reported on 06/09/2020) 30 tablet 0   No current facility-administered medications for this visit.    Patient confirms/reports the following allergies:  Allergies  Allergen Reactions  . Latex Rash    No orders of the defined types were placed in this encounter.   AUTHORIZATION INFORMATION Primary Insurance: 1D#: Group #:  Secondary Insurance: 1D#: Group #:  SCHEDULE INFORMATION: Date: 06/20/20 Time: Location:[facility]

## 2020-06-16 ENCOUNTER — Telehealth: Payer: Self-pay | Admitting: Gastroenterology

## 2020-06-16 NOTE — Telephone Encounter (Signed)
Patient called LVM to cancel her procedure for Monday 06/20/20 due to sicknes.   I do not see a procedure scheduled. Please advise.

## 2020-11-28 ENCOUNTER — Telehealth: Payer: Self-pay

## 2020-11-28 NOTE — Telephone Encounter (Signed)
Pt. Calling to reschedule colonoscopy

## 2021-01-05 ENCOUNTER — Inpatient Hospital Stay
Admission: EM | Admit: 2021-01-05 | Discharge: 2021-01-08 | DRG: 190 | Disposition: A | Discharge status: Home / Self Care | Payer: Medicare HMO | Attending: Internal Medicine | Admitting: Internal Medicine

## 2021-01-05 ENCOUNTER — Emergency Department: Payer: Medicare HMO

## 2021-01-05 ENCOUNTER — Encounter: Payer: Self-pay | Admitting: Emergency Medicine

## 2021-01-05 ENCOUNTER — Other Ambulatory Visit: Payer: Self-pay

## 2021-01-05 DIAGNOSIS — R0609 Other forms of dyspnea: Secondary | ICD-10-CM | POA: Diagnosis not present

## 2021-01-05 DIAGNOSIS — Z7951 Long term (current) use of inhaled steroids: Secondary | ICD-10-CM

## 2021-01-05 DIAGNOSIS — S2231XA Fracture of one rib, right side, initial encounter for closed fracture: Secondary | ICD-10-CM | POA: Diagnosis present

## 2021-01-05 DIAGNOSIS — Z85828 Personal history of other malignant neoplasm of skin: Secondary | ICD-10-CM

## 2021-01-05 DIAGNOSIS — F32A Depression, unspecified: Secondary | ICD-10-CM | POA: Diagnosis present

## 2021-01-05 DIAGNOSIS — Z20822 Contact with and (suspected) exposure to covid-19: Secondary | ICD-10-CM | POA: Diagnosis present

## 2021-01-05 DIAGNOSIS — J209 Acute bronchitis, unspecified: Secondary | ICD-10-CM | POA: Diagnosis present

## 2021-01-05 DIAGNOSIS — E876 Hypokalemia: Secondary | ICD-10-CM | POA: Diagnosis present

## 2021-01-05 DIAGNOSIS — Z79899 Other long term (current) drug therapy: Secondary | ICD-10-CM

## 2021-01-05 DIAGNOSIS — E871 Hypo-osmolality and hyponatremia: Secondary | ICD-10-CM | POA: Diagnosis present

## 2021-01-05 DIAGNOSIS — J441 Chronic obstructive pulmonary disease with (acute) exacerbation: Secondary | ICD-10-CM | POA: Diagnosis not present

## 2021-01-05 DIAGNOSIS — J440 Chronic obstructive pulmonary disease with acute lower respiratory infection: Secondary | ICD-10-CM | POA: Diagnosis present

## 2021-01-05 DIAGNOSIS — J45901 Unspecified asthma with (acute) exacerbation: Secondary | ICD-10-CM | POA: Diagnosis present

## 2021-01-05 DIAGNOSIS — I10 Essential (primary) hypertension: Secondary | ICD-10-CM | POA: Diagnosis present

## 2021-01-05 DIAGNOSIS — Z87891 Personal history of nicotine dependence: Secondary | ICD-10-CM

## 2021-01-05 DIAGNOSIS — R000 Tachycardia, unspecified: Secondary | ICD-10-CM | POA: Diagnosis present

## 2021-01-05 DIAGNOSIS — X58XXXA Exposure to other specified factors, initial encounter: Secondary | ICD-10-CM | POA: Diagnosis present

## 2021-01-05 DIAGNOSIS — Z803 Family history of malignant neoplasm of breast: Secondary | ICD-10-CM

## 2021-01-05 DIAGNOSIS — Z91040 Latex allergy status: Secondary | ICD-10-CM

## 2021-01-05 DIAGNOSIS — J9601 Acute respiratory failure with hypoxia: Secondary | ICD-10-CM | POA: Diagnosis present

## 2021-01-05 DIAGNOSIS — Z853 Personal history of malignant neoplasm of breast: Secondary | ICD-10-CM

## 2021-01-05 LAB — CBC
HCT: 41.6 % (ref 36.0–46.0)
Hemoglobin: 13.6 g/dL (ref 12.0–15.0)
MCH: 29.5 pg (ref 26.0–34.0)
MCHC: 32.7 g/dL (ref 30.0–36.0)
MCV: 90.2 fL (ref 80.0–100.0)
Platelets: 274 10*3/uL (ref 150–400)
RBC: 4.61 MIL/uL (ref 3.87–5.11)
RDW: 13.1 % (ref 11.5–15.5)
WBC: 14.2 10*3/uL — ABNORMAL HIGH (ref 4.0–10.5)
nRBC: 0 % (ref 0.0–0.2)

## 2021-01-05 LAB — BASIC METABOLIC PANEL
Anion gap: 8 (ref 5–15)
BUN: 12 mg/dL (ref 8–23)
CO2: 27 mmol/L (ref 22–32)
Calcium: 8.5 mg/dL — ABNORMAL LOW (ref 8.9–10.3)
Chloride: 98 mmol/L (ref 98–111)
Creatinine, Ser: 0.69 mg/dL (ref 0.44–1.00)
GFR, Estimated: >60 mL/min (ref >60)
Glucose, Bld: 123 mg/dL — ABNORMAL HIGH (ref 70–99)
Potassium: 3.4 mmol/L — ABNORMAL LOW (ref 3.5–5.1)
Sodium: 133 mmol/L — ABNORMAL LOW (ref 135–145)

## 2021-01-05 LAB — RESP PANEL BY RT-PCR (FLU A&B, COVID) ARPGX2
Influenza A by PCR: NEGATIVE
Influenza B by PCR: NEGATIVE
SARS Coronavirus 2 by RT PCR: NEGATIVE

## 2021-01-05 LAB — TROPONIN I (HIGH SENSITIVITY): Troponin I (High Sensitivity): 12 ng/L (ref <18)

## 2021-01-05 MED ORDER — IOHEXOL 350 MG/ML SOLN
100.0000 mL | Freq: Once | INTRAVENOUS | Status: AC | PRN
  Administered 2021-01-05: 100 mL via INTRAVENOUS

## 2021-01-05 MED ORDER — ALBUTEROL SULFATE (2.5 MG/3ML) 0.083% IN NEBU
5.0000 mg | INHALATION_SOLUTION | Freq: Once | RESPIRATORY_TRACT | Status: AC
  Administered 2021-01-05: 5 mg via RESPIRATORY_TRACT
  Filled 2021-01-05: qty 6

## 2021-01-05 MED ORDER — IPRATROPIUM-ALBUTEROL 0.5-2.5 (3) MG/3ML IN SOLN
3.0000 mL | Freq: Once | RESPIRATORY_TRACT | Status: AC
  Administered 2021-01-05: 3 mL via RESPIRATORY_TRACT

## 2021-01-05 MED ORDER — IPRATROPIUM-ALBUTEROL 0.5-2.5 (3) MG/3ML IN SOLN
3.0000 mL | Freq: Once | RESPIRATORY_TRACT | Status: AC
  Administered 2021-01-05: 3 mL via RESPIRATORY_TRACT
  Filled 2021-01-05: qty 9

## 2021-01-05 MED ORDER — PREDNISONE 20 MG PO TABS
60.0000 mg | ORAL_TABLET | Freq: Once | ORAL | Status: AC
  Administered 2021-01-05: 60 mg via ORAL
  Filled 2021-01-05: qty 3

## 2021-01-05 MED ORDER — AZITHROMYCIN 500 MG PO TABS
500.0000 mg | ORAL_TABLET | Freq: Once | ORAL | Status: AC
  Administered 2021-01-05: 500 mg via ORAL
  Filled 2021-01-05: qty 1

## 2021-01-05 NOTE — ED Provider Notes (Signed)
-----------------------------------------   11:15 PM on 01/05/2021 -----------------------------------------  Assuming care from Dr. Starleen Blue.  In short, Marie Fernandez is a 70 y.o. female with a chief complaint of SOB.  Refer to the original H&P for additional details.  The current plan of care is to follow up CTA chest.  Anticipate need for admission unless patient improves substantially.   ----------------------------------------- 12:19 AM on 01/06/2021 -----------------------------------------  CTA chest shows no sign of pulmonary embolism or infection although she does have multiple lobes with atelectasis.  She also has an acute right posterior rib fracture which most likely account for the some of the pain that she is feeling.  I checked the patient and she is still satting about 92% while sitting in bed.  She is slightly out of breath at rest with some tachypnea and increased work of breathing although she is also splinting, possibly due to the discomfort.  After breathing treatments and Solu-Medrol she is not wheezing but she has decreased air movement throughout.  Given the combination of factors described above, we talked about disposition plans and I believe she would be best suited for COPD exacerbation admission and likely would benefit from incentive spirometry.  I am ordering a dose of cough medicine for her and another albuterol 2.5 mg nebulizer treatment.  I am consulting the hospitalist for admission.  Patient agrees with the plan.   ----------------------------------------- 12:30 AM on 01/06/2021 -----------------------------------------  Discussed by phone with Dr. Sidney Ace who will admit.   Hinda Kehr, MD 01/06/21 0030

## 2021-01-05 NOTE — ED Triage Notes (Signed)
Pt comes into the ED via POV c/o cough, congestion, SHOB that has been intermittent x 1 month.  Pt does present labored in breathing at rest.  PT has been using her home inhalers with no success.

## 2021-01-05 NOTE — ED Provider Notes (Signed)
[facility]  ____________________________________________   Event Date/Time   First MD Initiated Contact with Patient 01/05/21 2127     (approximate)  I have reviewed the triage vital signs and the nursing notes.   HISTORY  Chief Complaint Cough, Nasal Congestion, and Shortness of Breath    HPI Marie Fernandez is a 70 y.o. female with past medical history of asthma/COPD who presents with shortness of breath and cough.  Patient symptoms have been going on for several weeks.  She endorses cough that is productive of a green sputum and shortness of breath.  She has pain in the center of her chest and pain in the right side of her chest worse with coughing but fairly constant.  She denies lower extremity swelling.  No history of blood clots.  She denies fevers.  She saw her primary care doctor today who told her to come to the emergency department.  She has no sick contacts.  Has had several episodes of emesis.         Past Medical History:  Diagnosis Date   Asthma    Breast cancer ([place]) 2015   left breast cancer ADH LCIS FEA   COPD (chronic obstructive pulmonary disease) ([place])    Skin cancer    squammous cell carcinoma    There are no problems to display for this patient.   Past Surgical History:  Procedure Laterality Date   BREAST BIOPSY Left 2015   stereo biopsy   BREAST EXCISIONAL BIOPSY Left 2015   revealing ADH, FEA, LCIS   BREAST LUMPECTOMY Left 08/21/2013   ADH, FEA, LCIS   COLONOSCOPY WITH PROPOFOL N/A 12/24/2017   Procedure: COLONOSCOPY WITH PROPOFOL;  Surgeon: Jonathon Bellows, MD;  Location: [facility] ENDOSCOPY;  Service: Gastroenterology;  Laterality: N/A;    Prior to Admission medications   Medication Sig Start Date End Date Taking? Authorizing Provider  ADVAIR DISKUS 500-50 MCG/ACT AEPB Inhale 1 puff into the lungs 2 (two) times daily. 11/28/20  Yes [provider]  albuterol (PROVENTIL HFA;VENTOLIN HFA) 108 (90 Base) MCG/ACT  inhaler Inhale 2 puffs into the lungs every 2 (two) hours as needed for wheezing or shortness of breath (cough). 11/13/17  Yes Drenda Freeze, MD  ATROVENT HFA 17 MCG/ACT inhaler Inhale 2 puffs into the lungs 4 (four) times daily. 05/20/20  Yes [provider]  citalopram (CELEXA) 40 MG tablet Take 40 mg by mouth daily. 05/20/20  Yes [provider]  hydrOXYzine (ATARAX/VISTARIL) 50 MG tablet Take 50 mg by mouth at bedtime as needed. 02/11/20  Yes [provider]  montelukast (SINGULAIR) 10 MG tablet Take 1 tablet by mouth at bedtime. 01/04/20  Yes [provider]    Allergies Latex  Family History  Problem Relation Age of Onset   Breast cancer Cousin    Breast cancer Maternal Grandmother     Social History Social History   Tobacco Use   Smoking status: Former   Smokeless tobacco: Never    Review of Systems   Review of Systems  Constitutional:  Negative for chills and fever.  Respiratory:  Positive for cough and shortness of breath.   Cardiovascular:  Positive for chest pain.  Gastrointestinal:  Positive for vomiting. Negative for abdominal pain.  All other systems reviewed and are negative.  Physical Exam Updated Vital Signs BP (!) 147/86 (BP Location: Right Arm)   Pulse 88   Temp 97.9 F (36.6 C) (Oral)   Resp (!) 22   Ht  5\' 3"  (1.6 m)   Wt 101.2 kg   SpO2 97%   BMI 39.52 kg/m   Physical Exam Vitals and nursing note reviewed.  Constitutional:      General: She is not in acute distress.    Appearance: Normal appearance.  HENT:     Head: Normocephalic and atraumatic.  Eyes:     General: No scleral icterus.    Conjunctiva/sclera: Conjunctivae normal.  Cardiovascular:     Rate and Rhythm: Tachycardia present.  Pulmonary:     Effort: Respiratory distress present.     Breath sounds: No stridor. Wheezing present.     Comments: Patient is winded moving to and from the chair to the bed  Expiratory wheezing  throughout Musculoskeletal:        General: No deformity or signs of injury.     Cervical back: Normal range of motion.     Right lower leg: No edema.     Left lower leg: No edema.  Skin:    General: Skin is dry.     Coloration: Skin is not jaundiced or pale.  Neurological:     General: No focal deficit present.     Mental Status: She is alert and oriented to person, place, and time. Mental status is at baseline.  Psychiatric:        Mood and Affect: Mood normal.        Behavior: Behavior normal.     LABS (all labs ordered are listed, but only abnormal results are displayed)  Labs Reviewed  BASIC METABOLIC PANEL - Abnormal; Notable for the following components:      Result Value   Sodium 133 (*)    Potassium 3.4 (*)    Glucose, Bld 123 (*)    Calcium 8.5 (*)    All other components within normal limits  CBC - Abnormal; Notable for the following components:   WBC 14.2 (*)    All other components within normal limits  RESP PANEL BY RT-PCR (FLU A&B, COVID) [id number]  TROPONIN I (HIGH SENSITIVITY)   ____________________________________________  EKG  Sinus tachycardia with PACs, normal axis, normal intervals, no acute ischemic changes ____________________________________________  RADIOLOGY Almeta Monas, personally viewed and evaluated these images (plain radiographs) as part of my medical decision making, as well as reviewing the written report by the radiologist.  ED MD interpretation: I reviewed the chest x-ray which shows increased interstitial markings but no focal infiltrate    ____________________________________________   PROCEDURES  Procedure(s) performed (including Critical Care):  Procedures   ____________________________________________   INITIAL IMPRESSION / ASSESSMENT AND PLAN / ED COURSE     Patient is a 70 year old female with history of COPD who presents with subacute illness of cough and shortness of breath.  She is fairly winded on  exam as she is moving from the chair to the bed.  She is tachypneic and can only speak in short sentences, but settles out once she has been sitting.  She does have expiratory wheezing and a frequent cough.  Does not appear volume overloaded.  She does have chest pain worse with coughing.  No fevers or chills does have cough productive of green sputum.  Chest x-ray is read as not having a focal infiltrate however there are increased interstitial markings consistent with a bronchitis pattern.  She has a leukocytosis of 14, COVID and flu are negative.  We will treat as a COPD exacerbation with duo nebs steroids and azithromycin.  If still feeling significantly  dyspneic after meds and improve wheezing may need to consider evaluation for pulmonary embolism however with her wheezing and symptoms feel that this is less likely and more likely to be a COPD exacerbation  After DuoNeb's, patient still symptomatic, wheezing is somewhat improved.  Pulse ox around 92% on room air.  Still looks winded.  Will give another albuterol neb and obtain a CT angio.  The patient was signed out to Dr. Karma Greaser.  Disposition is pending CT angio and reassessment.  Clinical Course as of 01/05/21 2341  Thu Jan 05, 2021  2221 Troponin I (High Sensitivity): 12 [KM]  2228 Troponin I (High Sensitivity): 12 [KM]    Clinical Course User Index [KM] Rada Hay, MD     ____________________________________________   FINAL CLINICAL IMPRESSION(S) / ED DIAGNOSES  Final diagnoses:  COPD exacerbation [facility])     ED Discharge Orders     None        Note:  This document was prepared using Dragon voice recognition software and may include unintentional dictation errors.    Rada Hay, MD 01/05/21 [phone]

## 2021-01-06 DIAGNOSIS — F32A Depression, unspecified: Secondary | ICD-10-CM

## 2021-01-06 DIAGNOSIS — Z79899 Other long term (current) drug therapy: Secondary | ICD-10-CM | POA: Diagnosis not present

## 2021-01-06 DIAGNOSIS — Z803 Family history of malignant neoplasm of breast: Secondary | ICD-10-CM | POA: Diagnosis not present

## 2021-01-06 DIAGNOSIS — J9601 Acute respiratory failure with hypoxia: Secondary | ICD-10-CM | POA: Diagnosis present

## 2021-01-06 DIAGNOSIS — J440 Chronic obstructive pulmonary disease with acute lower respiratory infection: Secondary | ICD-10-CM | POA: Diagnosis present

## 2021-01-06 DIAGNOSIS — E871 Hypo-osmolality and hyponatremia: Secondary | ICD-10-CM | POA: Diagnosis present

## 2021-01-06 DIAGNOSIS — E876 Hypokalemia: Secondary | ICD-10-CM

## 2021-01-06 DIAGNOSIS — Z7951 Long term (current) use of inhaled steroids: Secondary | ICD-10-CM | POA: Diagnosis not present

## 2021-01-06 DIAGNOSIS — J441 Chronic obstructive pulmonary disease with (acute) exacerbation: Secondary | ICD-10-CM | POA: Diagnosis present

## 2021-01-06 DIAGNOSIS — Z20822 Contact with and (suspected) exposure to covid-19: Secondary | ICD-10-CM | POA: Diagnosis present

## 2021-01-06 DIAGNOSIS — S2231XA Fracture of one rib, right side, initial encounter for closed fracture: Secondary | ICD-10-CM | POA: Diagnosis present

## 2021-01-06 DIAGNOSIS — Z91040 Latex allergy status: Secondary | ICD-10-CM | POA: Diagnosis not present

## 2021-01-06 DIAGNOSIS — Z85828 Personal history of other malignant neoplasm of skin: Secondary | ICD-10-CM | POA: Diagnosis not present

## 2021-01-06 DIAGNOSIS — X58XXXA Exposure to other specified factors, initial encounter: Secondary | ICD-10-CM | POA: Diagnosis present

## 2021-01-06 DIAGNOSIS — I10 Essential (primary) hypertension: Secondary | ICD-10-CM | POA: Diagnosis present

## 2021-01-06 DIAGNOSIS — R0609 Other forms of dyspnea: Secondary | ICD-10-CM | POA: Diagnosis present

## 2021-01-06 DIAGNOSIS — J209 Acute bronchitis, unspecified: Secondary | ICD-10-CM | POA: Diagnosis present

## 2021-01-06 DIAGNOSIS — Z853 Personal history of malignant neoplasm of breast: Secondary | ICD-10-CM | POA: Diagnosis not present

## 2021-01-06 DIAGNOSIS — R000 Tachycardia, unspecified: Secondary | ICD-10-CM | POA: Diagnosis present

## 2021-01-06 DIAGNOSIS — J45901 Unspecified asthma with (acute) exacerbation: Secondary | ICD-10-CM | POA: Diagnosis present

## 2021-01-06 DIAGNOSIS — Z87891 Personal history of nicotine dependence: Secondary | ICD-10-CM | POA: Diagnosis not present

## 2021-01-06 LAB — BASIC METABOLIC PANEL
Anion gap: 9 (ref 5–15)
BUN: 13 mg/dL (ref 8–23)
CO2: 25 mmol/L (ref 22–32)
Calcium: 8.8 mg/dL — ABNORMAL LOW (ref 8.9–10.3)
Chloride: 102 mmol/L (ref 98–111)
Creatinine, Ser: 0.65 mg/dL (ref 0.44–1.00)
GFR, Estimated: >60 mL/min (ref >60)
Glucose, Bld: 180 mg/dL — ABNORMAL HIGH (ref 70–99)
Potassium: 3.9 mmol/L (ref 3.5–5.1)
Sodium: 136 mmol/L (ref 135–145)

## 2021-01-06 LAB — HIV ANTIBODY (ROUTINE TESTING W REFLEX): HIV Screen 4th Generation wRfx: NONREACTIVE

## 2021-01-06 LAB — CBC
HCT: 40.5 % (ref 36.0–46.0)
Hemoglobin: 13.4 g/dL (ref 12.0–15.0)
MCH: 29.4 pg (ref 26.0–34.0)
MCHC: 33.1 g/dL (ref 30.0–36.0)
MCV: 88.8 fL (ref 80.0–100.0)
Platelets: 267 10*3/uL (ref 150–400)
RBC: 4.56 MIL/uL (ref 3.87–5.11)
RDW: 13 % (ref 11.5–15.5)
WBC: 11.5 10*3/uL — ABNORMAL HIGH (ref 4.0–10.5)
nRBC: 0 % (ref 0.0–0.2)

## 2021-01-06 LAB — MAGNESIUM: Magnesium: 2.1 mg/dL (ref 1.7–2.4)

## 2021-01-06 LAB — PROCALCITONIN: Procalcitonin: <0.1 ng/mL

## 2021-01-06 MED ORDER — HYDROCOD POLST-CPM POLST ER 10-8 MG/5ML PO SUER
5.0000 mL | Freq: Two times a day (BID) | ORAL | Status: DC | PRN
  Administered 2021-01-06 – 2021-01-08 (×3): 5 mL via ORAL
  Filled 2021-01-06 (×3): qty 5

## 2021-01-06 MED ORDER — MONTELUKAST SODIUM 10 MG PO TABS
10.0000 mg | ORAL_TABLET | Freq: Every day | ORAL | Status: DC
  Administered 2021-01-06 – 2021-01-07 (×3): 10 mg via ORAL
  Filled 2021-01-06 (×3): qty 1

## 2021-01-06 MED ORDER — ONDANSETRON HCL 4 MG PO TABS: 4.0000 mg | ORAL_TABLET | Freq: Four times a day (QID) | ORAL | Status: DC | PRN

## 2021-01-06 MED ORDER — POTASSIUM CHLORIDE 20 MEQ PO PACK
40.0000 meq | PACK | Freq: Once | ORAL | Status: AC
  Administered 2021-01-06: 40 meq via ORAL

## 2021-01-06 MED ORDER — ENOXAPARIN SODIUM 60 MG/0.6ML IJ SOSY
0.5000 mg/kg | PREFILLED_SYRINGE | INTRAMUSCULAR | Status: DC
  Administered 2021-01-06 – 2021-01-08 (×3): 50 mg via SUBCUTANEOUS
  Filled 2021-01-06 (×3): qty 0.6

## 2021-01-06 MED ORDER — ACETAMINOPHEN 325 MG PO TABS
650.0000 mg | ORAL_TABLET | Freq: Four times a day (QID) | ORAL | Status: DC | PRN
  Administered 2021-01-08 (×2): 650 mg via ORAL
  Filled 2021-01-06 (×2): qty 2

## 2021-01-06 MED ORDER — SODIUM CHLORIDE 0.9 % IV SOLN: INTRAVENOUS | Status: DC

## 2021-01-06 MED ORDER — HYDROCOD POLST-CPM POLST ER 10-8 MG/5ML PO SUER
5.0000 mL | Freq: Once | ORAL | Status: AC
  Administered 2021-01-06: 5 mL via ORAL
  Filled 2021-01-06: qty 5

## 2021-01-06 MED ORDER — CITALOPRAM HYDROBROMIDE 20 MG PO TABS
40.0000 mg | ORAL_TABLET | Freq: Every day | ORAL | Status: DC
  Administered 2021-01-06 – 2021-01-08 (×3): 40 mg via ORAL
  Filled 2021-01-06 (×3): qty 2

## 2021-01-06 MED ORDER — MAGNESIUM HYDROXIDE 400 MG/5ML PO SUSP
30.0000 mL | Freq: Every day | ORAL | Status: DC | PRN
  Filled 2021-01-06: qty 30

## 2021-01-06 MED ORDER — MORPHINE SULFATE (PF) 2 MG/ML IV SOLN
2.0000 mg | INTRAVENOUS | Status: DC | PRN
  Administered 2021-01-06 – 2021-01-07 (×3): 2 mg via INTRAVENOUS
  Filled 2021-01-06 (×3): qty 1

## 2021-01-06 MED ORDER — ONDANSETRON HCL 4 MG/2ML IJ SOLN: 4.0000 mg | Freq: Four times a day (QID) | INTRAMUSCULAR | Status: DC | PRN

## 2021-01-06 MED ORDER — HYDROXYZINE HCL 50 MG PO TABS
50.0000 mg | ORAL_TABLET | Freq: Every evening | ORAL | Status: DC | PRN
  Filled 2021-01-06: qty 1

## 2021-01-06 MED ORDER — TRAZODONE HCL 50 MG PO TABS
25.0000 mg | ORAL_TABLET | Freq: Every evening | ORAL | Status: DC | PRN
  Administered 2021-01-06 – 2021-01-07 (×2): 25 mg via ORAL
  Filled 2021-01-06 (×2): qty 1

## 2021-01-06 MED ORDER — METHYLPREDNISOLONE SODIUM SUCC 40 MG IJ SOLR
40.0000 mg | Freq: Two times a day (BID) | INTRAMUSCULAR | Status: AC
  Administered 2021-01-06 (×2): 40 mg via INTRAVENOUS
  Filled 2021-01-06 (×2): qty 1

## 2021-01-06 MED ORDER — GUAIFENESIN ER 600 MG PO TB12
600.0000 mg | ORAL_TABLET | Freq: Two times a day (BID) | ORAL | Status: DC
  Administered 2021-01-06 – 2021-01-08 (×6): 600 mg via ORAL
  Filled 2021-01-06 (×6): qty 1

## 2021-01-06 MED ORDER — KETOROLAC TROMETHAMINE 15 MG/ML IJ SOLN
15.0000 mg | Freq: Four times a day (QID) | INTRAMUSCULAR | Status: DC | PRN
  Administered 2021-01-06 – 2021-01-08 (×5): 15 mg via INTRAVENOUS
  Filled 2021-01-06 (×7): qty 1

## 2021-01-06 MED ORDER — LIDOCAINE 5 % EX PTCH
1.0000 | MEDICATED_PATCH | CUTANEOUS | Status: DC
  Administered 2021-01-06 – 2021-01-07 (×2): 1 via TRANSDERMAL
  Filled 2021-01-06 (×3): qty 1

## 2021-01-06 MED ORDER — ALBUTEROL SULFATE (2.5 MG/3ML) 0.083% IN NEBU
2.5000 mg | INHALATION_SOLUTION | Freq: Once | RESPIRATORY_TRACT | Status: AC
  Administered 2021-01-06: 2.5 mg via RESPIRATORY_TRACT
  Filled 2021-01-06: qty 3

## 2021-01-06 MED ORDER — ACETAMINOPHEN 650 MG RE SUPP: 650.0000 mg | Freq: Four times a day (QID) | RECTAL | Status: DC | PRN

## 2021-01-06 MED ORDER — SODIUM CHLORIDE 0.9 % IV SOLN
1.0000 g | INTRAVENOUS | Status: DC
  Administered 2021-01-06 (×2): 1 g via INTRAVENOUS
  Filled 2021-01-06: qty 10
  Filled 2021-01-06: qty 1

## 2021-01-06 MED ORDER — IPRATROPIUM-ALBUTEROL 0.5-2.5 (3) MG/3ML IN SOLN
3.0000 mL | Freq: Four times a day (QID) | RESPIRATORY_TRACT | Status: DC
  Administered 2021-01-06 – 2021-01-08 (×10): 3 mL via RESPIRATORY_TRACT
  Filled 2021-01-06 (×9): qty 3
  Filled 2021-01-06: qty 42

## 2021-01-06 MED ORDER — PREDNISONE 20 MG PO TABS
40.0000 mg | ORAL_TABLET | Freq: Every day | ORAL | Status: DC
  Administered 2021-01-07 – 2021-01-08 (×2): 40 mg via ORAL
  Filled 2021-01-06 (×2): qty 2

## 2021-01-06 MED ORDER — ALBUTEROL SULFATE (2.5 MG/3ML) 0.083% IN NEBU: 5.0000 mg | INHALATION_SOLUTION | Freq: Once | RESPIRATORY_TRACT | Status: DC

## 2021-01-06 NOTE — ED Notes (Signed)
O2 sat drops to high 80's while sleeping. 2 l [place] applied per md

## 2021-01-06 NOTE — H&P (Signed)
[place]   PATIENT NAME: Marie Fernandez    MR#:  [id number]  DATE OF BIRTH:  Jul 24, 1950  DATE OF ADMISSION:  01/05/2021  PRIMARY CARE PHYSICIAN: Marguerita Merles, MD   Patient is coming from: Home  REQUESTING/REFERRING PHYSICIAN: Hinda Kehr, MD  CHIEF COMPLAINT:   Chief Complaint  Patient presents with  . Cough  . Nasal Congestion  . Shortness of Breath    HISTORY OF PRESENT ILLNESS:  Marie Fernandez is a 70 y.o. Caucasian female with medical history significant for asthma and COPD, who presented to the emergency room with acute onset of worsening dyspnea with associated cough productive of green sputum as well as wheezing.  She has been having symptoms over the last 3 weeks but got significantly worse over the last couple of days.  The patient admitted to [place] chest pain with extension to the right side which has been worsening with cough.  No leg pain or edema or recent travels or surgeries.  No fever or chills.  She has been having occasional nausea and vomiting with excessive cough.  No abdominal pain or melena or bright red bleeding per rectum..  No dysuria, oliguria or hematuria or flank pain.  ED Course: When she came to the ER, blood pressure was 151/88 with respiratory rate 24 and pulse ox 94 % on room air.  Labs revealed mild hyponatremia and mild hypokalemia.  CBC showed leukocytosis of 14.2.  Influenza antigens and COVID-19 PCR came back negative.  EKG as reviewed by me : Showed a sinus tachycardia with a rate of 110. Imaging: Chest x-ray showed COPD/chronic bronchitis changes.  Chest CT a revealed no evidence for PE.  It showed acute nondisplaced posterolateral right seventh rib fracture and mild left upper lobe and right middle lobe and bibasilar atelectasis.  It showed small hiatal hernia, 4.2 cm right renal cyst and aortic atherosclerosis.  The patient was given 3 DuoNeb's, Tussionex, p.o. Zithromax, nebulized albuterol and 60 mg p.o. prednisone.  She will be  admitted to a telemetry medical bed for further evaluation and management. PAST MEDICAL HISTORY:   Past Medical History:  Diagnosis Date  . Asthma   . Breast cancer ([place]) 2015   left breast cancer ADH LCIS FEA  . COPD (chronic obstructive pulmonary disease) ([place])   . Skin cancer    squammous cell carcinoma    PAST SURGICAL HISTORY:   Past Surgical History:  Procedure Laterality Date  . BREAST BIOPSY Left 2015   stereo biopsy  . BREAST EXCISIONAL BIOPSY Left 2015   revealing ADH, FEA, LCIS  . BREAST LUMPECTOMY Left 08/21/2013   ADH, FEA, LCIS  . COLONOSCOPY WITH PROPOFOL N/A 12/24/2017   Procedure: COLONOSCOPY WITH PROPOFOL;  Surgeon: Jonathon Bellows, MD;  Location: [facility] ENDOSCOPY;  Service: Gastroenterology;  Laterality: N/A;    SOCIAL HISTORY:   Social History   Tobacco Use  . Smoking status: Former  . Smokeless tobacco: Never  Substance Use Topics  . Alcohol use: Not on file    FAMILY HISTORY:   Family History  Problem Relation Age of Onset  . Breast cancer Cousin   . Breast cancer Maternal Grandmother     DRUG ALLERGIES:   Allergies  Allergen Reactions  . Latex Rash    REVIEW OF SYSTEMS:   ROS As per history of present illness. All pertinent systems were reviewed above. Constitutional, HEENT, cardiovascular, respiratory, GI, GU, musculoskeletal, neuro, psychiatric, endocrine, integumentary and hematologic systems were reviewed  and are otherwise negative/unremarkable except for positive findings mentioned above in the HPI.   MEDICATIONS AT HOME:   Prior to Admission medications   Medication Sig Start Date End Date Taking? Authorizing Provider  ADVAIR DISKUS 500-50 MCG/ACT AEPB Inhale 1 puff into the lungs 2 (two) times daily. 11/28/20  Yes [provider]  albuterol (PROVENTIL HFA;VENTOLIN HFA) 108 (90 Base) MCG/ACT inhaler Inhale 2 puffs into the lungs every 2 (two) hours as needed for wheezing or shortness of breath (cough). 11/13/17  Yes Drenda Freeze, MD  ATROVENT HFA 17 MCG/ACT inhaler Inhale 2 puffs into the lungs 4 (four) times daily. 05/20/20  Yes [provider]  citalopram (CELEXA) 40 MG tablet Take 40 mg by mouth daily. 05/20/20  Yes [provider]  hydrOXYzine (ATARAX/VISTARIL) 50 MG tablet Take 50 mg by mouth at bedtime as needed. 02/11/20  Yes [provider]  montelukast (SINGULAIR) 10 MG tablet Take 1 tablet by mouth at bedtime. 01/04/20  Yes [provider]      VITAL SIGNS:  Blood pressure (!) 147/86, pulse 88, temperature 97.9 F (36.6 C), temperature source Oral, resp. rate (!) 22, height 5\' 3"  (1.6 m), weight 101.2 kg, SpO2 97 %.  PHYSICAL EXAMINATION:  Physical Exam  GENERAL:  70 y.o.-year-old Caucasian female patient lying in the bed with no acute distress.  EYES: Pupils equal, round, reactive to light and accommodation. No scleral icterus. Extraocular muscles intact.  HEENT: Head atraumatic, normocephalic. Oropharynx and nasopharynx clear.  NECK:  Supple, no jugular venous distention. No thyroid enlargement, no tenderness.  LUNGS: Diffuse expiratory wheezes with tight expiratory airflow and harsh vesicular breathing.   CARDIOVASCULAR: Regular rate and rhythm, S1, S2 normal. No murmurs, rubs, or gallops.  ABDOMEN: Soft, nondistended, nontender. Bowel sounds present. No organomegaly or mass.  EXTREMITIES: No pedal edema, cyanosis, or clubbing.  NEUROLOGIC: Cranial nerves II through XII are intact. Muscle strength 5/5 in all extremities. Sensation intact. Gait not checked.  PSYCHIATRIC: The patient is alert and oriented x 3.  Normal affect and good eye contact. SKIN: No obvious rash, lesion, or ulcer.   LABORATORY PANEL:   CBC Recent Labs  Lab 01/05/21 1322  WBC 14.2*  HGB 13.6  HCT 41.6  PLT 274   ------------------------------------------------------------------------------------------------------------------  Chemistries  Recent Labs  Lab 01/05/21 1322   NA 133*  K 3.4*  CL 98  CO2 27  GLUCOSE 123*  BUN 12  CREATININE 0.69  CALCIUM 8.5*   ------------------------------------------------------------------------------------------------------------------  Cardiac Enzymes No results for input(s): TROPONINI in the last 168 hours. ------------------------------------------------------------------------------------------------------------------  RADIOLOGY:  DG Chest 2 View  Result Date: 01/05/2021 CLINICAL DATA:  Cough.  Shortness of breath.  Breast cancer.  COPD. EXAM: CHEST - 2 VIEW COMPARISON:  11/13/2017 FINDINGS: Hyperinflation. Midline trachea. Normal heart size. Atherosclerosis in the transverse aorta. Lower lung predominant interstitial thickening. No lobar consolidation. IMPRESSION: COPD/chronic bronchitis. No acute superimposed process. Aortic Atherosclerosis (ICD10-I70.0).  - Electronically Signed   By: Abigail Miyamoto M.D.   On: 01/05/2021 13:57   CT Angio Chest PE W and/or Wo Contrast  Result Date: 01/05/2021 CLINICAL DATA:  Cough and congestion. EXAM: CT ANGIOGRAPHY CHEST WITH CONTRAST TECHNIQUE: Multidetector CT imaging of the chest was performed using the standard protocol during bolus administration of intravenous contrast. Multiplanar CT image reconstructions and MIPs were obtained to evaluate the vascular anatomy. CONTRAST:  175mL OMNIPAQUE IOHEXOL 350 MG/ML SOLN COMPARISON:  November 13, 2017 FINDINGS: Cardiovascular: There is marked severity calcification of the  aortic arch, without evidence of aortic aneurysm or dissection. The subsegmental pulmonary arteries are limited in evaluation secondary to suboptimal opacification with intravenous contrast. No evidence of pulmonary embolism. Normal heart size. No pericardial effusion. Mediastinum/Nodes: There is mild right hilar lymphadenopathy. Thyroid gland, trachea, and esophagus demonstrate no significant findings. Lungs/Pleura: Mild atelectatic changes are seen within the posterior  aspects of the bilateral lung bases and anteromedial aspects of the left upper lobe and right middle lobe. There is no evidence of a pleural effusion or pneumothorax. Upper Abdomen: There is a small hiatal hernia. A 4.2 cm diameter cyst is seen within the posterior aspect of the mid to upper right kidney. Musculoskeletal: Acute, nondisplaced posterolateral seventh right rib fracture is seen (axial CT image 47, CT series 6). A chronic posterolateral eighth left rib fracture is also seen. Mild multilevel degenerative changes are noted throughout the thoracic spine. Review of the MIP images confirms the above findings. IMPRESSION: 1. No evidence of acute pulmonary embolism. 2. Acute nondisplaced posterolateral seventh right rib fracture. 3. Mild left upper lobe, right middle lobe and bibasilar atelectasis. 4. Small hiatal hernia. 5. 4.2 cm right renal cyst. 6. Aortic atherosclerosis. Aortic Atherosclerosis (ICD10-I70.0). Electronically Signed   By: Virgina Norfolk M.D.   On: 01/05/2021 23:43      IMPRESSION AND PLAN:  Principal Problem:   COPD exacerbation ([place])  1.  Acute COPD/asthma exacerbation like secondary to acute bronchitis. - The patient will be admitted to a medical telemetry bed. - We will continue steroid therapy with IV Solu-Medrol. - Continue bronchodilator therapy with scheduled and as needed DuoNebs. - We will continue antibiotic therapy with IV Rocephin. - She will be given mucolytic therapy. - Advair Diskus will be held off. - We will continue her montelukast. - O2 protocol will be followed.  2.  Right lateral seventh rib fracture secondary to excessive cough. - Pain management will be provided.  3.  Depression. - We will continue Lexapro.   DVT prophylaxis: Lovenox. Code Status: full code. Family Communication:  The plan of care was discussed in details with the patient (and family). I answered all questions. The patient agreed to proceed with the above mentioned plan.  Further management will depend upon hospital course. Disposition Plan: Back to previous home environment Consults called: none. All the records are reviewed and case discussed with ED provider.  Status is: Inpatient  Remains inpatient appropriate because:Ongoing diagnostic testing needed not appropriate for outpatient work up, Unsafe d/c plan, IV treatments appropriate due to intensity of illness or inability to take PO, and Inpatient level of care appropriate due to severity of illness   Dispo: The patient is from: Home              Anticipated d/c is to: Home              Patient currently is not medically stable to d/c.              Difficult to place patient: No  TOTAL TIME TAKING CARE OF THIS PATIENT: 55 minutes.     Christel Mormon M.D on 01/06/2021 at 12:41 AM  [facility]   From 7 PM-7 AM, contact night-coverage [email or web address]  CC: Primary care physician; Marguerita Merles, MD

## 2021-01-06 NOTE — Progress Notes (Signed)
PROGRESS NOTE    Marie Fernandez  EPP:[id number] DOB: 25-Jul-1950 DOA: 01/05/2021 PCP: Marguerita Merles, MD   Chief complaint.  Shortness of breath. Brief Narrative:  Marie Fernandez is a 70 y.o. Caucasian female with medical history significant for asthma and COPD, who presented to the emergency room with acute onset of worsening dyspnea with associated cough productive of green sputum as well as wheezing. Her oxygen saturation dropped down to 87%, she was placed on 2 L oxygen.  She is also started on IV steroids for COPD exacerbation.  Assessment & Plan:   Principal Problem:   COPD exacerbation ([place])  COPD exacerbation with acute bronchitis. Acute hypoxemic respiratory failure. Patient feels much better today after giving steroids. She still has some bronchospasm, will continue steroids. Continue bronchodilator. Patient also placed on Rocephin, will check a procalcitonin level, if not elevated, will discontinue antibiotics. Patient may need home oxygen, we will obtain home oxygen evaluation tomorrow before discharge.  Right lateral seventh rib fracture secondary to severe cough. Pain control.   Essential hypertension. Patient was not taking blood pressure medicine at home, will continue monitor.    DVT prophylaxis: Lovenox Code Status: full Family Communication:  Disposition Plan:    Status is: Inpatient  Remains inpatient appropriate because: Severity of disease.  Requiring IV steroids        No intake/output data recorded. No intake/output data recorded.     Consultants:  None  Procedures: none  Antimicrobials: Rocephin Subjective: Patient still has been short of breath and wheezing.  But feels much better today.  She still have a cough, nonproductive. No fever chills  No dysuria hematuria    Objective: Vitals:   01/06/21 0048 01/06/21 0330 01/06/21 0609 01/06/21 0955  BP: (!) 175/65  (!) 172/83 (!) 148/78  Pulse: (!) 118 84 88 89  Resp:   (!) 22 18   Temp:      TempSrc:      SpO2: 93% 93% 93% 93%  Weight:      Height:       No intake or output data in the 24 hours ending 01/06/21 1011 Filed Weights   01/05/21 1319  Weight: 101.2 kg    Examination:  General exam: Appears calm and comfortable  Respiratory system: Decreased breathing sounds. Respiratory effort normal. Cardiovascular system: S1 & S2 heard, RRR. No JVD, murmurs, rubs, gallops or clicks. No pedal edema. Gastrointestinal system: Abdomen is nondistended, soft and nontender. No organomegaly or masses felt. Normal bowel sounds heard. Central nervous system: Alert and oriented. No focal neurological deficits. Extremities: Symmetric 5 x 5 power. Skin: No rashes, lesions or ulcers Psychiatry: Judgement and insight appear normal. Mood & affect appropriate.     Data Reviewed: I have personally reviewed following labs and imaging studies  CBC: Recent Labs  Lab 01/05/21 1322 01/06/21 0737  WBC 14.2* 11.5*  HGB 13.6 13.4  HCT 41.6 40.5  MCV 90.2 88.8  PLT 274 606   Basic Metabolic Panel: Recent Labs  Lab 01/05/21 1322 01/06/21 0737  NA 133* 136  K 3.4* 3.9  CL 98 102  CO2 27 25  GLUCOSE 123* 180*  BUN 12 13  CREATININE 0.69 0.65  CALCIUM 8.5* 8.8*  MG 2.1  --    GFR: Estimated Creatinine Clearance: 75.3 mL/min (by C-G formula based on SCr of 0.65 mg/dL). Liver Function Tests: No results for input(s): AST, ALT, ALKPHOS, BILITOT, PROT, ALBUMIN in the last 168 hours. No results for input(s): LIPASE, AMYLASE  in the last 168 hours. No results for input(s): AMMONIA in the last 168 hours. Coagulation Profile: No results for input(s): INR, PROTIME in the last 168 hours. Cardiac Enzymes: No results for input(s): CKTOTAL, CKMB, CKMBINDEX, TROPONINI in the last 168 hours. BNP (last 3 results) No results for input(s): PROBNP in the last 8760 hours. HbA1C: No results for input(s): HGBA1C in the last 72 hours. CBG: No results for input(s): GLUCAP in the last  168 hours. Lipid Profile: No results for input(s): CHOL, HDL, LDLCALC, TRIG, CHOLHDL, LDLDIRECT in the last 72 hours. Thyroid Function Tests: No results for input(s): TSH, T4TOTAL, FREET4, T3FREE, THYROIDAB in the last 72 hours. Anemia Panel: No results for input(s): VITAMINB12, FOLATE, FERRITIN, TIBC, IRON, RETICCTPCT in the last 72 hours. Sepsis Labs: No results for input(s): PROCALCITON, LATICACIDVEN in the last 168 hours.  Recent Results (from the past 240 hour(s))  Resp Panel by RT-PCR (Flu A&B, Covid) Nasopharyngeal Swab     Status: None   Collection Time: 01/05/21  1:21 PM   Specimen: Nasopharyngeal Swab; Nasopharyngeal(NP) swabs in vial transport medium  Result Value Ref Range Status   SARS Coronavirus 2 by RT PCR NEGATIVE NEGATIVE Final    Comment: (NOTE) SARS-CoV-2 target nucleic acids are NOT DETECTED.  The SARS-CoV-2 RNA is generally detectable in upper respiratory specimens during the acute phase of infection. The lowest concentration of SARS-CoV-2 viral copies this assay can detect is 138 copies/mL. A negative result does not preclude SARS-Cov-2 infection and should not be used as the sole basis for treatment or other patient management decisions. A negative result may occur with  improper specimen collection/handling, submission of specimen other than nasopharyngeal swab, presence of viral mutation(s) within the areas targeted by this assay, and inadequate number of viral copies(<138 copies/mL). A negative result must be combined with clinical observations, patient history, and epidemiological information. The expected result is Negative.  Fact Sheet for Patients:  EntrepreneurPulse.com.au  Fact Sheet for Healthcare Providers:  IncredibleEmployment.be  This test is no t yet approved or cleared by the [place] FDA and  has been authorized for detection and/or diagnosis of SARS-CoV-2 by FDA under an Emergency Use  Authorization (EUA). This EUA will remain  in effect (meaning this test can be used) for the duration of the COVID-19 declaration under Section 564(b)(1) of the Act, 21 U.S.C.section 360bbb-3(b)(1), unless the authorization is terminated  or revoked sooner.       Influenza A by PCR NEGATIVE NEGATIVE Final   Influenza B by PCR NEGATIVE NEGATIVE Final    Comment: (NOTE) The Xpert Xpress SARS-CoV-2/FLU/RSV plus assay is intended as an aid in the diagnosis of influenza from Nasopharyngeal swab specimens and should not be used as a sole basis for treatment. Nasal washings and aspirates are unacceptable for Xpert Xpress SARS-CoV-2/FLU/RSV testing.  Fact Sheet for Patients: EntrepreneurPulse.com.au  Fact Sheet for Healthcare Providers: IncredibleEmployment.be  This test is not yet approved or cleared by the [place] FDA and has been authorized for detection and/or diagnosis of SARS-CoV-2 by FDA under an Emergency Use Authorization (EUA). This EUA will remain in effect (meaning this test can be used) for the duration of the COVID-19 declaration under Section 564(b)(1) of the Act, 21 U.S.C. section 360bbb-3(b)(1), unless the authorization is terminated or revoked.  Performed at [facility], [address], [place], [place] [address]          Radiology Studies: DG Chest 2 View  Result Date: 01/05/2021 CLINICAL DATA:  Cough.  Shortness of breath.  Breast cancer.  COPD. EXAM: CHEST - 2 VIEW COMPARISON:  11/13/2017 FINDINGS: Hyperinflation. Midline trachea. Normal heart size. Atherosclerosis in the transverse aorta. Lower lung predominant interstitial thickening. No lobar consolidation. IMPRESSION: COPD/chronic bronchitis. No acute superimposed process. Aortic Atherosclerosis (ICD10-I70.0).  - Electronically Signed   By: Abigail Miyamoto M.D.   On: 01/05/2021 13:57   CT Angio Chest PE W and/or Wo Contrast  Result Date:  01/05/2021 CLINICAL DATA:  Cough and congestion. EXAM: CT ANGIOGRAPHY CHEST WITH CONTRAST TECHNIQUE: Multidetector CT imaging of the chest was performed using the standard protocol during bolus administration of intravenous contrast. Multiplanar CT image reconstructions and MIPs were obtained to evaluate the vascular anatomy. CONTRAST:  115mL OMNIPAQUE IOHEXOL 350 MG/ML SOLN COMPARISON:  November 13, 2017 FINDINGS: Cardiovascular: There is marked severity calcification of the aortic arch, without evidence of aortic aneurysm or dissection. The subsegmental pulmonary arteries are limited in evaluation secondary to suboptimal opacification with intravenous contrast. No evidence of pulmonary embolism. Normal heart size. No pericardial effusion. Mediastinum/Nodes: There is mild right hilar lymphadenopathy. Thyroid gland, trachea, and esophagus demonstrate no significant findings. Lungs/Pleura: Mild atelectatic changes are seen within the posterior aspects of the bilateral lung bases and anteromedial aspects of the left upper lobe and right middle lobe. There is no evidence of a pleural effusion or pneumothorax. Upper Abdomen: There is a small hiatal hernia. A 4.2 cm diameter cyst is seen within the posterior aspect of the mid to upper right kidney. Musculoskeletal: Acute, nondisplaced posterolateral seventh right rib fracture is seen (axial CT image 47, CT series 6). A chronic posterolateral eighth left rib fracture is also seen. Mild multilevel degenerative changes are noted throughout the thoracic spine. Review of the MIP images confirms the above findings. IMPRESSION: 1. No evidence of acute pulmonary embolism. 2. Acute nondisplaced posterolateral seventh right rib fracture. 3. Mild left upper lobe, right middle lobe and bibasilar atelectasis. 4. Small hiatal hernia. 5. 4.2 cm right renal cyst. 6. Aortic atherosclerosis. Aortic Atherosclerosis (ICD10-I70.0). Electronically Signed   By: Virgina Norfolk M.D.   On:  01/05/2021 23:43        Scheduled Meds:  citalopram  40 mg Oral Daily   enoxaparin (LOVENOX) injection  0.5 mg/kg Subcutaneous Q24H   guaiFENesin  600 mg Oral BID   ipratropium-albuterol  3 mL Nebulization QID   methylPREDNISolone (SOLU-MEDROL) injection  40 mg Intravenous Q12H   Followed by   Derrill Memo ON 01/07/2021] predniSONE  40 mg Oral Q breakfast   montelukast  10 mg Oral QHS   Continuous Infusions:  cefTRIAXone (ROCEPHIN)  IV Stopped (01/06/21 0145)     LOS: 0 days    Time spent: 28 minutes    Sharen Hones, MD [facility]   To contact the attending provider between 7A-7P or the covering provider during after hours 7P-7A, please log into the web site [email or web address] and access using universal [place] password for that web site. If you do not have the password, please call the hospital operator.  01/06/2021, 10:11 AM

## 2021-01-06 NOTE — ED Notes (Signed)
Pt given breakfast tray

## 2021-01-06 NOTE — Progress Notes (Signed)
PHARMACIST - PHYSICIAN COMMUNICATION  CONCERNING:  Enoxaparin (Lovenox) for DVT Prophylaxis    RECOMMENDATION: Patient was prescribed enoxaprin 40mg  q24 hours for VTE prophylaxis.   Filed Weights   01/05/21 1319  Weight: 101.2 kg (223 lb 1.7 oz)    Body mass index is 39.52 kg/m.  Estimated Creatinine Clearance: 75.3 mL/min (by C-G formula based on SCr of 0.69 mg/dL).   Based on [place] patient is candidate for enoxaparin 0.5mg /kg TBW SQ every 24 hours based on BMI being >30.  DESCRIPTION: Pharmacy has adjusted enoxaparin dose per [facility] policy.  Patient is now receiving enoxaparin 0.5 mg/kg every 24 hours   Renda Rolls, PharmD, [facility] 01/06/2021 12:48 AM

## 2021-01-07 DIAGNOSIS — E871 Hypo-osmolality and hyponatremia: Secondary | ICD-10-CM

## 2021-01-07 DIAGNOSIS — J9601 Acute respiratory failure with hypoxia: Secondary | ICD-10-CM

## 2021-01-07 NOTE — Progress Notes (Signed)
PROGRESS NOTE    Marie Fernandez  QXI:[id number] DOB: November 07, 1950 DOA: 01/05/2021 PCP: Marguerita Merles, MD   Chief complaint.  Shortness of breath. Brief Narrative:  Marie Fernandez is a 70 y.o. Caucasian female with medical history significant for asthma and COPD, who presented to the emergency room with acute onset of worsening dyspnea with associated cough productive of green sputum as well as wheezing. Her oxygen saturation dropped down to 87%, she was placed on 2 L oxygen.  She is also started on IV steroids for COPD exacerbation   Assessment & Plan:   Principal Problem:   COPD exacerbation ([place]) Active Problems:   Acute hypoxemic respiratory failure ([facility])   Hypokalemia  COPD exacerbation with acute bronchitis. Acute hypoxemic respiratory failure. Patient feels better today, however still on 2 L oxygen. No evidence of bacterial infection, discontinue antibiotics. Continue oxygen and antibiotics.  Anticipating discharge home tomorrow.  Right lateral seventh rib fracture secondary to severe cough. Continue pain medicine, added Lidoderm patch     Essential hypertension. Continue to monitor    DVT prophylaxis: Lovenox Code Status: full Family Communication:  Disposition Plan:      Status is: Inpatient   Remains inpatient appropriate because: Severity of disease.  Will need another day of treatment, potential discharge home tomorrow.        I/O last 3 completed shifts: In: 210 [IV Piggyback:210] Out: -  Total I/O In: 240 [P.O.:240] Out: -      Consultants:  None  Procedures: None  Antimicrobials: None  Subjective: Still has significant short of breath with minimal exertion.  Still on 2 L oxygen. Cough, nonproductive. No abdominal pain or nausea vomiting. No fever chills  No dysuria hematuria.  Objective: Vitals:   01/07/21 0704 01/07/21 0758 01/07/21 1114 01/07/21 1131  BP:  (!) 114/55  (!) 121/59  Pulse:  81  84  Resp:  16  15  Temp:  97.8 F  (36.6 C)  97.7 F (36.5 C)  TempSrc:  Oral  Oral  SpO2: 95% 95% 92% 96%  Weight:      Height:        Intake/Output Summary (Last 24 hours) at 01/07/2021 1152 Last data filed at 01/07/2021 0900 Gross per 24 hour  Intake 450 ml  Output --  Net 450 ml   Filed Weights   01/05/21 1319  Weight: 101.2 kg    Examination:  General exam: Appears calm and comfortable  Respiratory system: Decreased breathing sounds. Respiratory effort normal. Cardiovascular system: S1 & S2 heard, RRR. No JVD, murmurs, rubs, gallops or clicks. No pedal edema. Gastrointestinal system: Abdomen is nondistended, soft and nontender. No organomegaly or masses felt. Normal bowel sounds heard. Central nervous system: Alert and oriented. No focal neurological deficits. Extremities: Symmetric 5 x 5 power. Skin: No rashes, lesions or ulcers Psychiatry: Judgement and insight appear normal. Mood & affect appropriate.     Data Reviewed: I have personally reviewed following labs and imaging studies  CBC: Recent Labs  Lab 01/05/21 1322 01/06/21 0737  WBC 14.2* 11.5*  HGB 13.6 13.4  HCT 41.6 40.5  MCV 90.2 88.8  PLT 274 034   Basic Metabolic Panel: Recent Labs  Lab 01/05/21 1322 01/06/21 0737  NA 133* 136  K 3.4* 3.9  CL 98 102  CO2 27 25  GLUCOSE 123* 180*  BUN 12 13  CREATININE 0.69 0.65  CALCIUM 8.5* 8.8*  MG 2.1  --    GFR: Estimated Creatinine Clearance: 75.3 mL/min (  by C-G formula based on SCr of 0.65 mg/dL). Liver Function Tests: No results for input(s): AST, ALT, ALKPHOS, BILITOT, PROT, ALBUMIN in the last 168 hours. No results for input(s): LIPASE, AMYLASE in the last 168 hours. No results for input(s): AMMONIA in the last 168 hours. Coagulation Profile: No results for input(s): INR, PROTIME in the last 168 hours. Cardiac Enzymes: No results for input(s): CKTOTAL, CKMB, CKMBINDEX, TROPONINI in the last 168 hours. BNP (last 3 results) No results for input(s): PROBNP in the last 8760  hours. HbA1C: No results for input(s): HGBA1C in the last 72 hours. CBG: No results for input(s): GLUCAP in the last 168 hours. Lipid Profile: No results for input(s): CHOL, HDL, LDLCALC, TRIG, CHOLHDL, LDLDIRECT in the last 72 hours. Thyroid Function Tests: No results for input(s): TSH, T4TOTAL, FREET4, T3FREE, THYROIDAB in the last 72 hours. Anemia Panel: No results for input(s): VITAMINB12, FOLATE, FERRITIN, TIBC, IRON, RETICCTPCT in the last 72 hours. Sepsis Labs: Recent Labs  Lab 01/06/21 0737  PROCALCITON <0.10    Recent Results (from the past 240 hour(s))  Resp Panel by RT-PCR (Flu A&B, Covid) Nasopharyngeal Swab     Status: None   Collection Time: 01/05/21  1:21 PM   Specimen: Nasopharyngeal Swab; Nasopharyngeal(NP) swabs in vial transport medium  Result Value Ref Range Status   SARS Coronavirus 2 by RT PCR NEGATIVE NEGATIVE Final    Comment: (NOTE) SARS-CoV-2 target nucleic acids are NOT DETECTED.  The SARS-CoV-2 RNA is generally detectable in upper respiratory specimens during the acute phase of infection. The lowest concentration of SARS-CoV-2 viral copies this assay can detect is 138 copies/mL. A negative result does not preclude SARS-Cov-2 infection and should not be used as the sole basis for treatment or other patient management decisions. A negative result may occur with  improper specimen collection/handling, submission of specimen other than nasopharyngeal swab, presence of viral mutation(s) within the areas targeted by this assay, and inadequate number of viral copies(<138 copies/mL). A negative result must be combined with clinical observations, patient history, and epidemiological information. The expected result is Negative.  Fact Sheet for Patients:  EntrepreneurPulse.com.au  Fact Sheet for Healthcare Providers:  IncredibleEmployment.be  This test is no t yet approved or cleared by the [place] FDA and  has  been authorized for detection and/or diagnosis of SARS-CoV-2 by FDA under an Emergency Use Authorization (EUA). This EUA will remain  in effect (meaning this test can be used) for the duration of the COVID-19 declaration under Section 564(b)(1) of the Act, 21 U.S.C.section 360bbb-3(b)(1), unless the authorization is terminated  or revoked sooner.       Influenza A by PCR NEGATIVE NEGATIVE Final   Influenza B by PCR NEGATIVE NEGATIVE Final    Comment: (NOTE) The Xpert Xpress SARS-CoV-2/FLU/RSV plus assay is intended as an aid in the diagnosis of influenza from Nasopharyngeal swab specimens and should not be used as a sole basis for treatment. Nasal washings and aspirates are unacceptable for Xpert Xpress SARS-CoV-2/FLU/RSV testing.  Fact Sheet for Patients: EntrepreneurPulse.com.au  Fact Sheet for Healthcare Providers: IncredibleEmployment.be  This test is not yet approved or cleared by the [place] FDA and has been authorized for detection and/or diagnosis of SARS-CoV-2 by FDA under an Emergency Use Authorization (EUA). This EUA will remain in effect (meaning this test can be used) for the duration of the COVID-19 declaration under Section 564(b)(1) of the Act, 21 U.S.C. section 360bbb-3(b)(1), unless the authorization is terminated or revoked.  Performed at [facility], [address], [place], [place] [address]          Radiology Studies: DG Chest 2 View  Result Date: 01/05/2021 CLINICAL DATA:  Cough.  Shortness of breath.  Breast cancer.  COPD. EXAM: CHEST - 2 VIEW COMPARISON:  11/13/2017 FINDINGS: Hyperinflation. Midline trachea. Normal heart size. Atherosclerosis in the transverse aorta. Lower lung predominant interstitial thickening. No lobar consolidation. IMPRESSION: COPD/chronic bronchitis. No acute superimposed process. Aortic Atherosclerosis (ICD10-I70.0).  - Electronically Signed   By: Abigail Miyamoto M.D.   On:  01/05/2021 13:57   CT Angio Chest PE W and/or Wo Contrast  Result Date: 01/05/2021 CLINICAL DATA:  Cough and congestion. EXAM: CT ANGIOGRAPHY CHEST WITH CONTRAST TECHNIQUE: Multidetector CT imaging of the chest was performed using the standard protocol during bolus administration of intravenous contrast. Multiplanar CT image reconstructions and MIPs were obtained to evaluate the vascular anatomy. CONTRAST:  154mL OMNIPAQUE IOHEXOL 350 MG/ML SOLN COMPARISON:  November 13, 2017 FINDINGS: Cardiovascular: There is marked severity calcification of the aortic arch, without evidence of aortic aneurysm or dissection. The subsegmental pulmonary arteries are limited in evaluation secondary to suboptimal opacification with intravenous contrast. No evidence of pulmonary embolism. Normal heart size. No pericardial effusion. Mediastinum/Nodes: There is mild right hilar lymphadenopathy. Thyroid gland, trachea, and esophagus demonstrate no significant findings. Lungs/Pleura: Mild atelectatic changes are seen within the posterior aspects of the bilateral lung bases and anteromedial aspects of the left upper lobe and right middle lobe. There is no evidence of a pleural effusion or pneumothorax. Upper Abdomen: There is a small hiatal hernia. A 4.2 cm diameter cyst is seen within the posterior aspect of the mid to upper right kidney. Musculoskeletal: Acute, nondisplaced posterolateral seventh right rib fracture is seen (axial CT image 47, CT series 6). A chronic posterolateral eighth left rib fracture is also seen. Mild multilevel degenerative changes are noted throughout the thoracic spine. Review of the MIP images confirms the above findings. IMPRESSION: 1. No evidence of acute pulmonary embolism. 2. Acute nondisplaced posterolateral seventh right rib fracture. 3. Mild left upper lobe, right middle lobe and bibasilar atelectasis. 4. Small hiatal hernia. 5. 4.2 cm right renal cyst. 6. Aortic atherosclerosis. Aortic Atherosclerosis  (ICD10-I70.0). Electronically Signed   By: Virgina Norfolk M.D.   On: 01/05/2021 23:43        Scheduled Meds:  citalopram  40 mg Oral Daily   enoxaparin (LOVENOX) injection  0.5 mg/kg Subcutaneous Q24H   guaiFENesin  600 mg Oral BID   ipratropium-albuterol  3 mL Nebulization QID   lidocaine  1 patch Transdermal Q24H   montelukast  10 mg Oral QHS   predniSONE  40 mg Oral Q breakfast   Continuous Infusions:   LOS: 1 day    Time spent: 28 minutes    Sharen Hones, MD [facility]   To contact the attending provider between 7A-7P or the covering provider during after hours 7P-7A, please log into the web site [email or web address] and access using universal [place] password for that web site. If you do not have the password, please call the hospital operator.  01/07/2021, 11:52 AM

## 2021-01-07 NOTE — Progress Notes (Signed)
Pt slept off and on through the night, voices needs

## 2021-01-08 DIAGNOSIS — E876 Hypokalemia: Secondary | ICD-10-CM

## 2021-01-08 MED ORDER — TRAMADOL HCL 50 MG PO TABS: 25.0000 mg | ORAL_TABLET | Freq: Four times a day (QID) | ORAL | 0 refills | Status: DC | PRN

## 2021-01-08 MED ORDER — GUAIFENESIN ER 600 MG PO TB12: 600.0000 mg | ORAL_TABLET | Freq: Two times a day (BID) | ORAL | 0 refills | Status: DC

## 2021-01-08 MED ORDER — PREDNISONE 20 MG PO TABS: ORAL_TABLET | ORAL | 0 refills | Status: AC

## 2021-01-08 MED ORDER — LIDOCAINE 5 % EX PTCH: 1.0000 | MEDICATED_PATCH | CUTANEOUS | 0 refills | Status: AC

## 2021-01-08 NOTE — Discharge Summary (Signed)
Physician Discharge Summary  Patient ID: JAMARI DIANA MRN: [id number] DOB/AGE: 1950/08/14 70 y.o.  Admit date: 01/05/2021 Discharge date: 01/08/2021  Admission Diagnoses:  Discharge Diagnoses:  Principal Problem:   COPD exacerbation ([place]) Active Problems:   Acute hypoxemic respiratory failure ([place])   Hypokalemia   Hyponatremia   Discharged Condition: fair  Hospital Course:  AZURA TUFARO is a 70 y.o. Caucasian female with medical history significant for asthma and COPD, who presented to the emergency room with acute onset of worsening dyspnea with associated cough productive of green sputum as well as wheezing. Her oxygen saturation dropped down to 87%, she was placed on 2 L oxygen.  She is also started on IV steroids for COPD exacerbation  COPD exacerbation with acute bronchitis. Acute hypoxemic respiratory failure. Patient was initially placed on antibiotics, she does not have evidence of infection.  Antibiotic discontinued.  She is giving IV steroids initially, currently on oral prednisone.  Her condition had improved. She is still on 2 L oxygen, will obtain home oxygen evaluation.   Right lateral seventh rib fracture secondary to severe cough. Patient is advised to continue use incentive spirometer after discharge.  I will prescribe a few tablets of pain medicine in addition to Lidoderm patch.     Essential hypertension. Resume home treatment.   Consults: None  Significant Diagnostic Studies:   Treatments: Oxygen and steroids  Discharge Exam: Blood pressure 128/66, pulse 84, temperature (!) 97.5 F (36.4 C), temperature source Oral, resp. rate 16, height 5\' 3"  (1.6 m), weight 101.2 kg, SpO2 95 %. General appearance: alert and cooperative Resp: diminished breath sounds anterior - bilateral and bibasilar Cardio: regular rate and rhythm, S1, S2 normal, no murmur, click, rub or gallop GI: soft, non-tender; bowel sounds normal; no masses,  no organomegaly Extremities:  extremities normal, atraumatic, no cyanosis or edema  Disposition: Discharge disposition: 01-Home or Self Care       Discharge Instructions     Diet - low sodium heart healthy   Complete by: As directed    Increase activity slowly   Complete by: As directed       Allergies as of 01/08/2021       Reactions   Latex Rash        Medication List     TAKE these medications    Advair Diskus 500-50 MCG/ACT Aepb Generic drug: fluticasone-salmeterol Inhale 1 puff into the lungs 2 (two) times daily.   albuterol 108 (90 Base) MCG/ACT inhaler Commonly known as: VENTOLIN HFA Inhale 2 puffs into the lungs every 2 (two) hours as needed for wheezing or shortness of breath (cough).   Atrovent HFA 17 MCG/ACT inhaler Generic drug: ipratropium Inhale 2 puffs into the lungs 4 (four) times daily.   citalopram 40 MG tablet Commonly known as: CELEXA Take 40 mg by mouth daily.   guaiFENesin 600 MG 12 hr tablet Commonly known as: MUCINEX Take 1 tablet (600 mg total) by mouth 2 (two) times daily.   hydrOXYzine 50 MG tablet Commonly known as: ATARAX Take 50 mg by mouth at bedtime as needed.   lidocaine 5 % Commonly known as: LIDODERM Place 1 patch onto the skin daily. Remove & Discard patch within 12 hours or as directed by MD   montelukast 10 MG tablet Commonly known as: SINGULAIR Take 1 tablet by mouth at bedtime.   predniSONE 20 MG tablet Commonly known as: DELTASONE Take 2 tablets (40 mg total) by mouth daily with breakfast for 2 days, THEN  1 tablet (20 mg total) daily with breakfast for 2 days, THEN 0.5 tablets (10 mg total) daily with breakfast for 2 days. Start taking on: January 09, 2021   traMADol 50 MG tablet Commonly known as: Ultram Take 0.5 tablets (25 mg total) by mouth every 6 (six) hours as needed.        Follow-up Information     Marguerita Merles, MD Follow up in 1 week(s).   Specialty: Family Medicine Contact information: [address] [place] [id number] [phone]                 Signed: Sharen Hones 01/08/2021, 9:50 AM

## 2021-01-08 NOTE — TOC Transition Note (Addendum)
Transition of Care [facility]) - CM/SW Discharge Note   Patient Details  Name: Marie Fernandez MRN: [id number] Date of Birth: [birth year]  [facility] CM/SW Contact:  Harriet Masson, RN Phone Number:[phone] 01/08/2021, 12:36 PM   Clinical Narrative:    Unable to reach the daughter Jonelle Sidle) or leave a HIPAA voice message. Spoke with pt concerning the recommendations for DME agency preference. Pt receptive to any agency for this request.. Pt will need home O2 based upon sat scores and a walker. States she only has a cane and has good support system. Pt lives in a single family home with her daughter and grandson. Daughter will be picking pt up from the hospital today and taking pt to all her medical appointments. Pt can afford her ongoing medication at her local pharmacy.   [facility]) called with this referral for Home O2 to be delivered to the hospital for portable and concentrator to be set up in the home at the address noted in Epic. Pt will also received the requested walker deliver to the pt's room. Agency was not able to give me a ETA on the delivery of the DME. No other questions or inquires at this time.  Addendum 1:11 pm Pt aware [facility] will delivery her home O2 to bedside and not to discharge until she has been set up with the portable unit. Gilford Rile will also be delivered to bedside.   TOC will continue to follow.   Final next level of care: Home/Self Care Barriers to Discharge: Barriers Resolved   Patient Goals and CMS Choice     Choice offered to / list presented to : Patient  Discharge Placement                  Name of family member notified:  (Attempted to call daughter Jonelle Sidle (mailbox full)) Patient and family notified of of transfer: 01/08/21  Discharge Plan and Services                DME Arranged: Oxygen, Walker rolling DME Agency: [facility] Date DME Agency Contacted: 01/08/21 Time DME Agency Contacted: [phone]               Social Determinants of Health ([place]) Interventions     Readmission Risk Interventions No flowsheet data found.

## 2021-01-08 NOTE — Evaluation (Signed)
Physical Therapy Evaluation Patient Details Name: Marie Fernandez MRN: [id number] DOB: 09-01-1950 Today's Date: 01/08/2021  History of Present Illness  Pt is a 70 y/o F who was admitted 01/05/21 with c/c of acute onset of worsening dyspnea with associated cough with production of green sputum. Pt is being treated for COPD exacerbation with acute bronchitis & acute hypoexmic respiratory failure. Pt found to have R 7th rib fx 2/2 severe cough. PMH: asthma, COPD  Clinical Impression  Pt seen for PT evaluation with pt reporting she's independent without AD prior to admission. On this date, pt is limited by R rib pain with all movement & ambulates with guarded gait without AD with CGA. Provided pt with RW & pt with improvement in balance & rib pain with mobility with pt able to ambulate in room & use restroom without assistance. Will continue to follow pt acutely to address gait with LRAD & stair negotiation but do not anticipate pt will require f/u therapy upon d/c.   Pt on 2L/min via nasal cannula throughout session.       Recommendations for follow up therapy are one component of a multi-disciplinary discharge planning process, led by the attending physician.  Recommendations may be updated based on patient status, additional functional criteria and insurance authorization.  Follow Up Recommendations No PT follow up    Assistance Recommended at Discharge PRN  Functional Status Assessment  (minimal decline in functional status 2/2 rib pain)  Equipment Recommendations  Rolling walker (2 wheels)    Recommendations for Other Services       Precautions / Restrictions Precautions Precautions: None Restrictions Weight Bearing Restrictions: No      Mobility  Bed Mobility Overal bed mobility: Modified Independent                  Transfers Overall transfer level: Modified independent Equipment used: None;Rolling walker (2 wheels)               General transfer comment:  sit<>stand without AD & with RW    Ambulation/Gait Ambulation/Gait assistance: Min guard Gait Distance (Feet):  (5 ft without AD with CGA, 100 ft with RW & mod I) Assistive device: Rolling walker (2 wheels);None   Gait velocity: decreased     General Gait Details: When ambulating without AD pt with wide BOS, decreased step length BLE, decreased stride length, decreased gait speed. Pt demonstrates improved balance with RW. Pt overall limited by rib pain with movement.  Stairs            Wheelchair Mobility    Modified Rankin (Stroke Patients Only)       Balance Overall balance assessment: Needs assistance   Sitting balance-Leahy Scale: Normal     Standing balance support: No upper extremity supported;During functional activity Standing balance-Leahy Scale: Fair                               Pertinent Vitals/Pain Pain Assessment: Faces Faces Pain Scale: Hurts worst Pain Location: R ribs Pain Descriptors / Indicators: Discomfort;Grimacing;Aching Pain Intervention(s): Limited activity within patient's tolerance;Monitored during session;Repositioned    Home Living Family/patient expects to be discharged to:: Private residence Living Arrangements: Children (daughter) Available Help at Discharge: Available PRN/intermittently;Family Type of Home: House Home Access: Stairs to enter Entrance Stairs-Rails:  (1 rail) Technical brewer of Steps: 3-4   Home Layout: One level Home Equipment: None      Prior Function Prior Level of  Function : Independent/Modified Independent                     Hand Dominance        Extremity/Trunk Assessment   Upper Extremity Assessment Upper Extremity Assessment: Generalized weakness (pt limited overall by pain)    Lower Extremity Assessment Lower Extremity Assessment: Overall WFL for tasks assessed (pt limited overall by pain)       Communication   Communication: No difficulties  Cognition  Arousal/Alertness: Awake/alert Behavior During Therapy: WFL for tasks assessed/performed Overall Cognitive Status: Within Functional Limits for tasks assessed                                          General Comments      Exercises     Assessment/Plan    PT Assessment Patient needs continued PT services  PT Problem List Decreased balance;Decreased activity tolerance;Pain;Decreased mobility       PT Treatment Interventions DME instruction;Therapeutic activities;Modalities;Gait training;Therapeutic exercise;Patient/family education;Stair training;Balance training;Functional mobility training;Neuromuscular re-education;Manual techniques    PT Goals (Current goals can be found in the Care Plan section)  Acute Rehab PT Goals Patient Stated Goal: decreased rib pain, go home PT Goal Formulation: With patient Time For Goal Achievement: 01/22/21 Potential to Achieve Goals: Good    Frequency Min 2X/week   Barriers to discharge        Co-evaluation               AM-PAC PT "6 Clicks" Mobility  Outcome Measure Help needed turning from your back to your side while in a flat bed without using bedrails?: None Help needed moving from lying on your back to sitting on the side of a flat bed without using bedrails?: None Help needed moving to and from a bed to a chair (including a wheelchair)?: None Help needed standing up from a chair using your arms (e.g., wheelchair or bedside chair)?: None Help needed to walk in hospital room?: None Help needed climbing 3-5 steps with a railing? : A Little 6 Click Score: 23    End of Session Equipment Utilized During Treatment: Oxygen Activity Tolerance: Patient tolerated treatment well;Patient limited by pain Patient left: in bed;with call bell/phone within reach   PT Visit Diagnosis: Unsteadiness on feet (R26.81);Muscle weakness (generalized) (M62.81);Pain Pain - Right/Left: Right Pain - part of body:  (ribs)    Time:  5427-0623 PT Time Calculation (min) (ACUTE ONLY): 8 min   Charges:   PT Evaluation $PT Eval Low Complexity: [place], PT, DPT 01/08/21, 1:18 PM   Marie Fernandez 01/08/2021, 1:17 PM

## 2021-01-08 NOTE — Progress Notes (Signed)
SATURATION QUALIFICATIONS: (This note is used to comply with regulatory documentation for home oxygen)  Patient Saturations on Room Air at Rest =  90%  Patient Saturations on Room Air while Ambulating = 86%  Patient Saturations on 2 Liters of oxygen while Ambulating = 95%   Please briefly explain why patient needs home oxygen:  Patient rib pain seems to keep her from taking a deep breath on top of her other lung issues

## 2021-06-16 ENCOUNTER — Other Ambulatory Visit: Payer: Self-pay | Admitting: Family Medicine

## 2021-06-20 ENCOUNTER — Other Ambulatory Visit: Payer: Self-pay | Admitting: Family Medicine

## 2021-06-20 DIAGNOSIS — N6002 Solitary cyst of left breast: Secondary | ICD-10-CM

## 2021-07-10 ENCOUNTER — Ambulatory Visit
Admission: RE | Admit: 2021-07-10 | Discharge: 2021-07-10 | Disposition: A | Discharge status: Home / Self Care | Payer: Medicare HMO | Source: Ambulatory Visit | Attending: Family Medicine | Admitting: Family Medicine

## 2021-07-10 DIAGNOSIS — N6002 Solitary cyst of left breast: Secondary | ICD-10-CM | POA: Diagnosis not present

## 2021-07-12 ENCOUNTER — Other Ambulatory Visit: Payer: Self-pay | Admitting: Family Medicine

## 2021-07-12 DIAGNOSIS — R928 Other abnormal and inconclusive findings on diagnostic imaging of breast: Secondary | ICD-10-CM

## 2021-07-12 DIAGNOSIS — R591 Generalized enlarged lymph nodes: Secondary | ICD-10-CM

## 2021-07-12 DIAGNOSIS — N63 Unspecified lump in unspecified breast: Secondary | ICD-10-CM

## 2021-07-28 ENCOUNTER — Ambulatory Visit
Admission: RE | Admit: 2021-07-28 | Discharge: 2021-07-28 | Disposition: A | Discharge status: Home / Self Care | Payer: Medicare HMO | Source: Ambulatory Visit | Attending: Family Medicine | Admitting: Family Medicine

## 2021-07-28 DIAGNOSIS — N63 Unspecified lump in unspecified breast: Secondary | ICD-10-CM

## 2021-07-28 DIAGNOSIS — R591 Generalized enlarged lymph nodes: Secondary | ICD-10-CM | POA: Diagnosis not present

## 2021-07-28 DIAGNOSIS — R928 Other abnormal and inconclusive findings on diagnostic imaging of breast: Secondary | ICD-10-CM | POA: Diagnosis not present

## 2021-07-28 DIAGNOSIS — C773 Secondary and unspecified malignant neoplasm of axilla and upper limb lymph nodes: Secondary | ICD-10-CM | POA: Insufficient documentation

## 2021-07-28 HISTORY — PX: BREAST BIOPSY: SHX20

## 2021-07-31 ENCOUNTER — Encounter: Payer: Self-pay | Admitting: *Deleted

## 2021-07-31 DIAGNOSIS — C50919 Malignant neoplasm of unspecified site of unspecified female breast: Secondary | ICD-10-CM

## 2021-07-31 LAB — SURGICAL PATHOLOGY

## 2021-08-07 ENCOUNTER — Inpatient Hospital Stay: Payer: Medicare HMO | Attending: Oncology | Admitting: Oncology

## 2021-08-07 ENCOUNTER — Inpatient Hospital Stay: Payer: Medicare HMO

## 2021-08-07 ENCOUNTER — Encounter: Payer: Self-pay | Admitting: Oncology

## 2021-08-07 VITALS — BP 124/78 | HR 87 | Temp 98.4°F | Resp 18 | Ht 67.0 in | Wt 241.0 lb

## 2021-08-07 DIAGNOSIS — Z171 Estrogen receptor negative status [ER-]: Secondary | ICD-10-CM | POA: Diagnosis not present

## 2021-08-07 DIAGNOSIS — Z7189 Other specified counseling: Secondary | ICD-10-CM | POA: Insufficient documentation

## 2021-08-07 DIAGNOSIS — J9610 Chronic respiratory failure, unspecified whether with hypoxia or hypercapnia: Secondary | ICD-10-CM | POA: Insufficient documentation

## 2021-08-07 DIAGNOSIS — C779 Secondary and unspecified malignant neoplasm of lymph node, unspecified: Secondary | ICD-10-CM | POA: Diagnosis not present

## 2021-08-07 DIAGNOSIS — Z803 Family history of malignant neoplasm of breast: Secondary | ICD-10-CM | POA: Diagnosis not present

## 2021-08-07 DIAGNOSIS — C50412 Malignant neoplasm of upper-outer quadrant of left female breast: Secondary | ICD-10-CM | POA: Diagnosis present

## 2021-08-07 DIAGNOSIS — Z9981 Dependence on supplemental oxygen: Secondary | ICD-10-CM | POA: Insufficient documentation

## 2021-08-07 DIAGNOSIS — Z87891 Personal history of nicotine dependence: Secondary | ICD-10-CM | POA: Insufficient documentation

## 2021-08-07 NOTE — Progress Notes (Signed)
Hematology/Oncology Consult note [facility] Telephone:([phone] [phone]  Patient Care Team: Marguerita Merles, MD as PCP - General (Family Medicine) Daiva Huge, RN as Oncology Nurse Navigator   Name of the patient: Marie Fernandez  [id number]  11/05/1950    Reason for referral-new diagnosis of breast cancer   Referring physician-Dr. Delight Stare  Date of visit: 08/07/21   History of presenting illness- Patient is a 71 year old female who underwent a bilateral mammogram in June 2023 which showed an irregular hypoechoic mass at the 2:30 position 6 cm from the nipple in the left breast measuring 1.8 x 1.4 x 1.7 cm.  Single morphologically abnormal lymph node in the left axilla.  This was followed by a ultrasound and biopsy.  Left breast biopsy showed invasive mammary carcinoma grade 312 mm.  Lymph node biopsy was also positive for macro metastatic mammary carcinoma.  ER more than 90% positive PR 11 to 20% positive and HER2 negative.  Patient will be seeing Dr. Hampton Abbot later this week.  Patient had an abnormal mammogram which showed a possible distortion/small complicated cyst at the same position of the left breast back in 2019 and a repeat mammogram was recommended in 6 months.  However due to [place] pandemic and death of her son patient did not follow through.  Family history significant for breast cancer in her maternal grandmother and maternal first cousin.  No other history of colon, gastric pancreatic cancer or melanoma's.  Patient has not had any prior abnormal breast biopsies.  She has 3 adult daughters.  Menarche at the age of 12.  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  She has been on oxygen for a year now and last saw Dr. Raul Del in April 2023.  She lives with her daughter and her family.  She is not very active but is independent of her ADLs.  ECOG PS- 1-2  Pain scale- 0   Review of systems- Review of  Systems  Constitutional:  Positive for malaise/fatigue. Negative for chills, fever and weight loss.  HENT:  Negative for congestion, ear discharge and nosebleeds.   Eyes:  Negative for blurred vision.  Respiratory:  Positive for shortness of breath. Negative for cough, hemoptysis, sputum production and wheezing.   Cardiovascular:  Negative for chest pain, palpitations, orthopnea and claudication.  Gastrointestinal:  Negative for abdominal pain, blood in stool, constipation, diarrhea, heartburn, melena, nausea and vomiting.  Genitourinary:  Negative for dysuria, flank pain, frequency, hematuria and urgency.  Musculoskeletal:  Negative for back pain, joint pain and myalgias.  Skin:  Negative for rash.  Neurological:  Negative for dizziness, tingling, focal weakness, seizures, weakness and headaches.  Endo/Heme/Allergies:  Does not bruise/bleed easily.  Psychiatric/Behavioral:  Negative for depression and suicidal ideas. The patient does not have insomnia.     Allergies  Allergen Reactions   Latex Rash    Patient Active Problem List   Diagnosis Date Noted   Hyponatremia 01/07/2021   COPD exacerbation ([place]) 01/06/2021   Acute hypoxemic respiratory failure ([place]) 01/06/2021   Hypokalemia 01/06/2021     Past Medical History:  Diagnosis Date   Asthma    Breast cancer ([place]) 2015   left breast cancer ADH LCIS FEA   COPD (chronic obstructive pulmonary disease) (HCC)    Skin cancer    squammous cell carcinoma     Past Surgical History:  Procedure Laterality Date   BREAST BIOPSY Left 2015   stereo biopsy   BREAST  BIOPSY Left 07/28/2021   [place] Core Bx 2:30 6cmfn Heart clip, hydro clip in axilla, path pending   BREAST EXCISIONAL BIOPSY Left 2015   revealing ADH, FEA, LCIS   BREAST LUMPECTOMY Left 08/21/2013   ADH, FEA, LCIS   COLONOSCOPY WITH PROPOFOL N/A 12/24/2017   Procedure: COLONOSCOPY WITH PROPOFOL;  Surgeon: Jonathon Bellows, MD;  Location: [facility];  Service:  Gastroenterology;  Laterality: N/A;    Social History   Socioeconomic History   Marital status: Widowed    Spouse name: Not on file   Number of children: Not on file   Years of education: Not on file   Highest education level: Not on file  Occupational History   Not on file  Tobacco Use   Smoking status: Former   Smokeless tobacco: Never  Substance and Sexual Activity   Alcohol use: Not on file   Drug use: Not on file   Sexual activity: Not on file  Other Topics Concern   Not on file  Social History Narrative   Not on file   Social Determinants of Health   Financial Resource Strain: Not on file  Food Insecurity: Not on file  Transportation Needs: Not on file  Physical Activity: Not on file  Stress: Not on file  Social Connections: Not on file  Intimate Partner Violence: Not on file     Family History  Problem Relation Age of Onset   Breast cancer Cousin    Breast cancer Maternal Grandmother      Current Outpatient Medications:    ADVAIR DISKUS 500-50 MCG/ACT AEPB, Inhale 1 puff into the lungs 2 (two) times daily., Disp: , Rfl:    albuterol (PROVENTIL HFA;VENTOLIN HFA) 108 (90 Base) MCG/ACT inhaler, Inhale 2 puffs into the lungs every 2 (two) hours as needed for wheezing or shortness of breath (cough)., Disp: 1 Inhaler, Rfl: 0   citalopram (CELEXA) 40 MG tablet, Take 40 mg by mouth daily., Disp: , Rfl:    fluticasone (FLONASE) 50 MCG/ACT nasal spray, Place into both nostrils., Disp: , Rfl:    guaiFENesin (MUCINEX) 600 MG 12 hr tablet, Take 1 tablet (600 mg total) by mouth 2 (two) times daily., Disp: 20 tablet, Rfl: 0   lidocaine (LIDODERM) 5 %, Place 1 patch onto the skin daily. Remove & Discard patch within 12 hours or as directed by MD, Disp: 30 patch, Rfl: 0   montelukast (SINGULAIR) 10 MG tablet, Take 1 tablet by mouth at bedtime., Disp: , Rfl:    SPIRIVA HANDIHALER 18 MCG inhalation capsule, 1 capsule daily., Disp: , Rfl:    Physical exam:  Vitals:    08/07/21 1334  Resp: 18  Weight: 241 lb (109.3 kg)  Height: '5\' 7"'  (1.702 m)   Physical Exam Constitutional:      Comments: She is on oxygen  Cardiovascular:     Rate and Rhythm: Normal rate and regular rhythm.     Heart sounds: Normal heart sounds.  Pulmonary:     Effort: Pulmonary effort is normal.     Breath sounds: Normal breath sounds.  Abdominal:     General: Bowel sounds are normal.     Palpations: Abdomen is soft.  Skin:    General: Skin is warm and dry.  Neurological:     Mental Status: She is alert and oriented to person, place, and time.           Latest Ref Rng & Units 01/06/2021    7:37 AM  CMP  Glucose  70 - 99 mg/dL 180   BUN 8 - 23 mg/dL 13   Creatinine 0.44 - 1.00 mg/dL 0.65   Sodium 135 - 145 mmol/L 136   Potassium 3.5 - 5.1 mmol/L 3.9   Chloride 98 - 111 mmol/L 102   CO2 22 - 32 mmol/L 25   Calcium 8.9 - 10.3 mg/dL 8.8       Latest Ref Rng & Units 01/06/2021    7:37 AM  CBC  WBC 4.0 - 10.5 K/uL 11.5   Hemoglobin 12.0 - 15.0 g/dL 13.4   Hematocrit 36.0 - 46.0 % 40.5   Platelets 150 - 400 K/uL 267     No images are attached to the encounter.  [place] AXILLARY NODE CORE BIOPSY LEFT  Addendum Date: 08/01/2021   ADDENDUM REPORT: 08/01/2021 10:13 ADDENDUM: Pathology revealed GRADE 3 INVASIVE MAMMARY CARCINOMA, NO SPECIAL TYPE of the LEFT breast, 2:30 o'clock, 6 cmfn (heart clip). This was found to be concordant by Dr. Kristopher Oppenheim. Pathology revealed POSITIVE FOR MALIGNANCY- MACROMETASTATIC MAMMARY CARCINOMA of the LEFT axillary lymph node (hydromark clip). This was found to be concordant by Dr. Kristopher Oppenheim. Pathology results were discussed with the patient by telephone. The patient reported doing well after the biopsies with tenderness at the sites. Post biopsy instructions and care were reviewed and questions were answered. The patient was encouraged to call [facility] for any additional concerns.  Recommendation: surgical consultation for excision A surgical and medical oncologist referral will be arranged by Casper Harrison RN, Oncology Nurse Navigator of [facility]. Pathology results reported by Stacie Acres RN on 08/01/2021. Electronically Signed   By: Kristopher Oppenheim M.D.   On: 08/01/2021 10:13   Result Date: 08/01/2021 CLINICAL DATA:  71 year old female with suspicious left breast mass and axillary lymph node. EXAM: ULTRASOUND GUIDED LEFT BREAST CORE NEEDLE BIOPSY x2 COMPARISON:  None Available. PROCEDURE: I met with the patient and we discussed the procedure of ultrasound-guided biopsy, including benefits and alternatives. We discussed the high likelihood of a successful procedure. We discussed the risks of the procedure, including infection, bleeding, tissue injury, clip migration, and inadequate sampling. Informed written consent was given. The usual time-out protocol was performed immediately prior to the procedure. Lesion quadrant: Upper outer quadrant Using sterile technique and 1% Lidocaine as local anesthetic, under direct ultrasound visualization, a 14 gauge spring-loaded device was used to perform biopsy of a mass at the 2:30 position using a inferior approach. At the conclusion of the procedure a heart shaped tissue marker clip was deployed into the biopsy cavity. Using sterile technique and 1% Lidocaine as local anesthetic, under direct ultrasound visualization, a 14 gauge spring-loaded device was used to perform biopsy of a left axillary lymph node using a inferior approach. At the conclusion of the procedure a HydroMARK tissue marker clip was deployed into the biopsy cavity. Follow up 2 view mammogram was performed and dictated separately. IMPRESSION: Ultrasound guided biopsy of the left breast and axilla. No apparent complications. Electronically Signed: By: Kristopher Oppenheim M.D. On: 07/28/2021 09:09   [place] LT BREAST BX W LOC DEV 1ST LESION IMG BX SPEC US  GUIDE  Addendum Date: 08/01/2021   ADDENDUM REPORT: 08/01/2021 10:13 ADDENDUM: Pathology revealed GRADE 3 INVASIVE MAMMARY CARCINOMA, NO SPECIAL TYPE of the LEFT breast, 2:30 o'clock, 6 cmfn (heart clip). This was found to be concordant by Dr. Kristopher Oppenheim. Pathology revealed POSITIVE FOR MALIGNANCY- MACROMETASTATIC MAMMARY CARCINOMA of  the LEFT axillary lymph node (hydromark clip). This was found to be concordant by Dr. Kristopher Oppenheim. Pathology results were discussed with the patient by telephone. The patient reported doing well after the biopsies with tenderness at the sites. Post biopsy instructions and care were reviewed and questions were answered. The patient was encouraged to call [facility] for any additional concerns. Recommendation: surgical consultation for excision A surgical and medical oncologist referral will be arranged by Casper Harrison RN, Oncology Nurse Navigator of [facility] of [facility]. Pathology results reported by Stacie Acres RN on 08/01/2021. Electronically Signed   By: Kristopher Oppenheim M.D.   On: 08/01/2021 10:13   Result Date: 08/01/2021 CLINICAL DATA:  71 year old female with suspicious left breast mass and axillary lymph node. EXAM: ULTRASOUND GUIDED LEFT BREAST CORE NEEDLE BIOPSY x2 COMPARISON:  None Available. PROCEDURE: I met with the patient and we discussed the procedure of ultrasound-guided biopsy, including benefits and alternatives. We discussed the high likelihood of a successful procedure. We discussed the risks of the procedure, including infection, bleeding, tissue injury, clip migration, and inadequate sampling. Informed written consent was given. The usual time-out protocol was performed immediately prior to the procedure. Lesion quadrant: Upper outer quadrant Using sterile technique and 1% Lidocaine as local anesthetic, under direct ultrasound visualization, a 14 gauge spring-loaded device was used  to perform biopsy of a mass at the 2:30 position using a inferior approach. At the conclusion of the procedure a heart shaped tissue marker clip was deployed into the biopsy cavity. Using sterile technique and 1% Lidocaine as local anesthetic, under direct ultrasound visualization, a 14 gauge spring-loaded device was used to perform biopsy of a left axillary lymph node using a inferior approach. At the conclusion of the procedure a HydroMARK tissue marker clip was deployed into the biopsy cavity. Follow up 2 view mammogram was performed and dictated separately. IMPRESSION: Ultrasound guided biopsy of the left breast and axilla. No apparent complications. Electronically Signed: By: Kristopher Oppenheim M.D. On: 07/28/2021 09:09   MM CLIP PLACEMENT LEFT  Result Date: 07/28/2021 CLINICAL DATA:  71 year old female status post ultrasound-guided biopsy of the left breast and axilla. EXAM: 3D DIAGNOSTIC LEFT MAMMOGRAM POST ULTRASOUND BIOPSY COMPARISON:  Previous exam(s). FINDINGS: 3D Mammographic images were obtained following ultrasound guided biopsy of the left breast and axilla. The biopsy marking clips are in the expected position. IMPRESSION: Appropriate positioning of both post biopsy marking clips in the left breast and axilla. Final Assessment: Post Procedure Mammograms for Marker Placement Electronically Signed   By: Kristopher Oppenheim M.D.   On: 07/28/2021 09:07  MM DIAG BREAST TOMO BILATERAL  Result Date: 07/10/2021 CLINICAL DATA:  71 year old female presenting for annual bilateral mammogram and delayed follow-up of the left breast. EXAM: DIGITAL DIAGNOSTIC BILATERAL MAMMOGRAM WITH TOMOSYNTHESIS AND CAD; ULTRASOUND LEFT BREAST LIMITED TECHNIQUE: Bilateral digital diagnostic mammography and breast tomosynthesis was performed. The images were evaluated with computer-aided detection.; Targeted ultrasound examination of the left breast was performed. COMPARISON:  Previous exam(s). ACR Breast Density Category b: There  are scattered areas of fibroglandular density. FINDINGS: An irregular, spiculated hyperdense mass is demonstrated in the upper outer left breast at posterior depth. Otherwise, no new or suspicious findings in either breast. The remainder of the parenchymal pattern is stable. Targeted ultrasound is performed, showing an irregular, hypoechoic mass with associated vascularity at the 2:30 position 6 cm from the nipple. It measures 1.8 x 1.4 x 1.7 cm. This  correlates with the mammographic finding. A single morphologically abnormal lymph node is noted in the left axilla. It demonstrates 6 mm of diffuse cortical thickening. IMPRESSION: 1. Highly suspicious left breast mass at the 2:30 position. Recommendation is for ultrasound-guided biopsy. 2. Suspicious left axillary lymphadenopathy. Recommendation is for ultrasound-guided biopsy. 3. No mammographic evidence of malignancy on the right. RECOMMENDATION: Two area ultrasound-guided biopsy of the left breast and axilla. I have discussed the findings and recommendations with the patient. If applicable, a reminder letter will be sent to the patient regarding the next appointment. BI-RADS CATEGORY  5: Highly suggestive of malignancy. Electronically Signed   By: Kristopher Oppenheim M.D.   On: 07/10/2021 11:50  US BREAST LTD UNI LEFT INC AXILLA  Result Date: 07/10/2021 CLINICAL DATA:  71 year old female presenting for annual bilateral mammogram and delayed follow-up of the left breast. EXAM: DIGITAL DIAGNOSTIC BILATERAL MAMMOGRAM WITH TOMOSYNTHESIS AND CAD; ULTRASOUND LEFT BREAST LIMITED TECHNIQUE: Bilateral digital diagnostic mammography and breast tomosynthesis was performed. The images were evaluated with computer-aided detection.; Targeted ultrasound examination of the left breast was performed. COMPARISON:  Previous exam(s). ACR Breast Density Category b: There are scattered areas of fibroglandular density. FINDINGS: An irregular, spiculated hyperdense mass is demonstrated in  the upper outer left breast at posterior depth. Otherwise, no new or suspicious findings in either breast. The remainder of the parenchymal pattern is stable. Targeted ultrasound is performed, showing an irregular, hypoechoic mass with associated vascularity at the 2:30 position 6 cm from the nipple. It measures 1.8 x 1.4 x 1.7 cm. This correlates with the mammographic finding. A single morphologically abnormal lymph node is noted in the left axilla. It demonstrates 6 mm of diffuse cortical thickening. IMPRESSION: 1. Highly suspicious left breast mass at the 2:30 position. Recommendation is for ultrasound-guided biopsy. 2. Suspicious left axillary lymphadenopathy. Recommendation is for ultrasound-guided biopsy. 3. No mammographic evidence of malignancy on the right. RECOMMENDATION: Two area ultrasound-guided biopsy of the left breast and axilla. I have discussed the findings and recommendations with the patient. If applicable, a reminder letter will be sent to the patient regarding the next appointment. BI-RADS CATEGORY  5: Highly suggestive of malignancy. Electronically Signed   By: Kristopher Oppenheim M.D.   On: 07/10/2021 11:50   Assessment and plan- Patient is a 71 y.o. female with newly diagnosed clinical prognostic stage IIA invasive mammary carcinoma of the left breast cT1 ccN1 acM0 ER strongly positive greater than 90%, PR weakly +11 to 20% and HER2 negative here to discuss further management  Discussed the results of mammogram ultrasound and pathology with the patient in detail.  She has stage IIa breast cancer with a primary breast mass that was 1.8 cm grade 3 ER strongly positive PR weakly +11 to 20%  and HER2 negative.  There was one abnormal appearing lymph node which was also positive for malignancy.  At this time I would recommend upfront lumpectomy and sentinel lymph node biopsy as well as removal of the targeted lymph node that was biopsied and clipped.  If patient has less than 3 lymph nodes  positive she would benefit from Oncotype testing to see if she would require adjuvant chemotherapy.  If she has more than 3 lymph nodes positive at the time of surgery she would benefit from adjuvant chemotherapy regardless.  Discussed what Oncotype testing is and how the results are interpreted.  If her score is 25 or less she would not benefit from adjuvant chemotherapy.  However if her score is 26 or  higher adjuvant chemotherapy would be indicated.  Although patient does not have any overt cardiovascular risk factors, I would be concerned about using anthracyclines given her age.  I will await final pathology results before sending Oncotype testing.  Hold off on port placement at this time.  I will get into the details of chemotherapy if need be after Oncotype testing results are back.  After lumpectomy patient will benefit from adjuvant radiation therapy as well.  Also her tumor is ER positive and she would benefit from adjuvant endocrine therapy which I will discuss with her in greater detail down the line.  She does not require any systemic scans at this time for stage II disease.  However if there is more disease found at the time of surgery I will consider getting CT scans.  Given her family history of breast cancer I will refer the patient to genetic counseling as well.  I will tentatively see her back in about 3-1/2 weeks after her final pathology is back.  She will need pulmonary clearance prior to surgery as well.   Cancer Staging  Malignant neoplasm of upper-outer quadrant of left breast in female, estrogen receptor negative ([place]) Staging form: Breast, AJCC 8th Edition - Clinical stage from 08/07/2021: Stage IIA (cT1c, cN1(f), cM0, G3, ER+, PR+, HER2-) - Signed by Sindy Guadeloupe, MD on 08/07/2021 Method of lymph node assessment: Core biopsy Histologic grading system: 3 grade system     Thank you for this kind referral and the opportunity to participate in the care of this  patient   Visit Diagnosis 1. Malignant neoplasm of upper-outer quadrant of left breast in female, estrogen receptor negative ([place])   2. Goals of care, counseling/discussion     Dr. Randa Evens, MD, MPH [facility] [phone] 08/07/2021

## 2021-08-07 NOTE — Addendum Note (Signed)
Addended by: Sindy Guadeloupe on: 08/07/2021 02:37 PM   Modules accepted: Orders

## 2021-08-11 ENCOUNTER — Ambulatory Visit (INDEPENDENT_AMBULATORY_CARE_PROVIDER_SITE_OTHER): Payer: Medicare HMO | Admitting: Surgery

## 2021-08-11 ENCOUNTER — Other Ambulatory Visit: Payer: Self-pay

## 2021-08-11 ENCOUNTER — Telehealth: Payer: Self-pay

## 2021-08-11 ENCOUNTER — Encounter: Payer: Self-pay | Admitting: Surgery

## 2021-08-11 VITALS — BP 144/82 | HR 88 | Temp 98.1°F | Ht 64.0 in | Wt 239.2 lb

## 2021-08-11 DIAGNOSIS — Z170 Estrogen receptor positive status [ER+]: Secondary | ICD-10-CM

## 2021-08-11 DIAGNOSIS — Z171 Estrogen receptor negative status [ER-]: Secondary | ICD-10-CM

## 2021-08-11 DIAGNOSIS — C50412 Malignant neoplasm of upper-outer quadrant of left female breast: Secondary | ICD-10-CM | POA: Diagnosis not present

## 2021-08-11 NOTE — Patient Instructions (Addendum)
Our surgery scheduler will call you within 24-48 hours to schedule surgery. Please have the [place] surgery sheet available when speaking with her.  Pulmonary Clearance faxed to Dr.Herbon Raul Del at this time. Please call his office to see if you need to schedule an office visit prior to your surgery.   Lumpectomy  A lumpectomy, sometimes called a partial mastectomy, is surgery to remove a cancerous tumor or mass (the lump) from a breast. It is a form of breast-conserving or breast-preservation surgery. This means that the cancerous tissue is removed but the breast remains intact. During a lumpectomy, the portion of the breast that contains the tumor is removed. Some normal tissue around the lump may be taken out to make sure that all of the tumor has been removed. Lymph nodes under your arm may also be removed and tested to find out if the cancer has spread. Lymph nodes are part of the body's disease-fighting system (immune system) and are usually the first place where breast cancer spreads. Tell a health care provider about: Any allergies you have. All medicines you are taking, including vitamins, herbs, eye drops, creams, and over-the-counter medicines. Any problems you or family members have had with anesthetic medicines. Any blood disorders you have. Any surgeries you have had. Any medical conditions you have. Whether you are pregnant or may be pregnant. What are the risks? Generally, this is a safe procedure. However, problems may occur, including: Bleeding. Infection. Allergic reaction to medicines. Pain, swelling, weakness, or numbness in the arm on the side of your surgery. Temporary swelling. Change in the shape of the breast, particularly if a large portion is removed. Scar tissue that forms at the surgical site and feels hard to the touch. Blood clots. What happens before the procedure? Staying hydrated Follow instructions from your health care provider about hydration, which  may include: Up to 2 hours before the procedure - you may continue to drink clear liquids, such as water, clear fruit juice, black coffee, and plain tea.  Eating and drinking restrictions Follow instructions from your health care provider about eating and drinking, which may include: 8 hours before the procedure - stop eating heavy meals or foods, such as meat, fried foods, or fatty foods. 6 hours before the procedure - stop eating light meals or foods, such as toast or cereal. 6 hours before the procedure - stop drinking milk or drinks that contain milk. 2 hours before the procedure - stop drinking clear liquids. Medicines Ask your health care provider about: Changing or stopping your regular medicines. This is especially important if you are taking diabetes medicines or blood thinners. Taking medicines such as aspirin and ibuprofen. These medicines can thin your blood. Do not take these medicines unless your health care provider tells you to take them. Taking over-the-counter medicines, vitamins, herbs, and supplements. General instructions Prior to surgery, your health care provider may do a procedure to locate and mark the tumor area in your breast (localization). This will help guide your surgeon to where the incision will be made. This may be done with: Imaging, such as a mammogram, ultrasound, or MRI. Insertion of a small wire, clip, or seed, or an implant that will reflect a radar signal. You may have screening tests or exams to get baseline measurements of your arm. These can be compared to measurements done after surgery to monitor for swelling (lymphedema) that can develop after having lymph nodes removed. Ask your health care provider: How your surgery site will be marked.  What steps will be taken to help prevent infection. These may include: Washing skin with a germ-killing soap. Taking antibiotic medicine. Plan to have someone take you home from the hospital or clinic. Plan to  have a responsible adult care for you for at least 24 hours after you leave the hospital or clinic. This is important. What happens during the procedure?  An IV will be inserted into one of your veins. You will be given one or more of the following: A medicine to help you relax (sedative). A medicine to numb the area (local anesthetic). A medicine to make you fall asleep (general anesthetic). Your health care provider will use a kind of electric scalpel that uses heat to reduce bleeding (electrocautery knife). A curved incision that follows the natural curve of your breast will be made. This type of incision will allow for minimal scarring and better healing. The tumor will be removed along with some of the tissue around it. This will be sent to the lab for testing. Your health care provider may also remove lymph nodes at this time if needed. If the tumor is close to the muscles over your chest, some muscle tissue may also be removed. A small drain tube may be inserted into your breast area or armpit to collect fluid that may build up after surgery. This tube will be connected to a suction bulb on the outside of your body to remove the fluid. The incision will be closed with stitches (sutures). A bandage (dressing) may be placed over the incision. The procedure may vary among health care providers and hospitals. What happens after the procedure? Your blood pressure, heart rate, breathing rate, and blood oxygen level will be monitored until you leave the hospital or clinic. You will be given medicine for pain as needed. Your IV will be removed when you are able to eat and drink by mouth. You will be encouraged to get up and walk as soon as you can. This is important to improve blood flow and breathing. Ask for help if you feel weak or unsteady. You may have: A drain tube in place for 2-3 days to prevent a collection of blood (hematoma) from developing in the breast. You will be given instructions  about caring for the drain before you go home. A pressure bandage applied for 1-2 days to prevent bleeding or swelling. Your pressure bandage may look like a thick piece of fabric or an elastic wrap. Ask your health care provider how to care for your bandage at home. You may be given a tight sleeve to wear over your arm on the side of your surgery. You should wear this sleeve as told by your health care provider. Do not drive for 24 hours if you were given a sedative during your procedure. Summary A lumpectomy, sometimes called a partial mastectomy, is surgery to remove a cancerous tumor or mass (the lump) from a breast. During a lumpectomy, the portion of the breast that contains the tumor is removed. Lymph nodes under your arm may also be removed and tested to find out if the cancer has spread. Plan to have someone take you home from the hospital or clinic. You may have a drain tube in place for 2-3 days to prevent a collection of blood (hematoma) from developing in the breast. You will be given instructions about caring for the drain before you go home. This information is not intended to replace advice given to you by your health care provider.  Make sure you discuss any questions you have with your health care provider. Document Revised: 07/28/2018 Document Reviewed: 07/28/2018 Elsevier Patient Education  [place].

## 2021-08-11 NOTE — H&P (View-Only) (Signed)
08/11/2021  Reason for Visit: Left breast cancer with metastasis to axillary lymph node  Requesting Provider: Casper Harrison, RN  History of Present Illness: Marie Fernandez is a 71 y.o. female presenting for evaluation of left breast cancer.  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 Tamala Julian in 2015.  Her last mammogram had been in 01/09/2018 which showed a left breast mass at 2:30 position, 6 cm from the nipple measuring 4 x 3 x 4 mm consistent with a mildly complicated cyst.  55-monthfollow-up is recommended but the patient did not follow-up due to the C[place]adisepandemic as well as the death of her son in a car accident.  She had her more recent studies on 07/10/2021 which consisted of diagnostic bilateral mammogram and left ultrasound which showed that now this mass measured 1.8 x 1.4 x 1.7 cm and was irregular and hypoechoic.  In the axilla, a single abnormal lymph node was noted with 6 mm of diffuse cortical thickening.  Both of these were biopsied on 07/28/2021 were positive for invasive breast cancer, ER/PR positive and HER2 negative.  The lymph node was positive with macrometastatic mammary carcinoma, 13 mm in greatest linear extent.  The patient has seen Dr. RJanese Bankson 08/07/2021 and she has recommended upfront lumpectomy with sentinel lymph node biopsy.  The patient has a family history of breast cancer in her maternal grandmother and cousin.  Her first menstrual cycle was at age 71  She has had 3 children which were breast-fed.  She did go through menopause at age 71  She did not feel any masses initially although currently she can feel the area at the biopsy site.  Denies any prior skin changes, or nipple changes.  Of note, the patient has severe COPD and uses oxygen at home particularly when she is ambulating.  She is on Advair and Spiriva as well as albuterol.  Past Medical History: Past Medical History:  Diagnosis Date   Asthma    Breast cancer [place]   left  breast cancer ADH LCIS FEA   [place] disease[place]    Skin cancer    squammous cell carcinoma     Past Surgical History: Past Surgical History:  Procedure Laterality Date   BREAST BIOPSY Left 2015   stereo biopsy   BREAST BIOPSY Left 07/28/2021   [place] Bx 2:30 6cmfn Heart clip, hydro clip in axilla, path pending   BREAST EXCISIONAL BIOPSY Left 2015   revealing ADH, FEA, LCIS   BREAST LUMPECTOMY Left 08/21/2013   ADH, FEA, LCIS   COLONOSCOPY WITH PROPOFOL N/A 12/24/2017   Procedure: COLONOSCOPY WITH PROPOFOL;  Surgeon: AJonathon Bellows MD;  [facility]  Service: Gastroenterology;  Laterality: N/A;    Home Medications: Prior to Admission medications   Medication Sig Start Date End Date Taking? Authorizing Provider  ADVAIR DISKUS 500-50 MCG/ACT AEPB Inhale 1 puff into the lungs 2 (two) times daily. 11/28/20  Yes [provider]  albuterol (PROVENTIL HFA;VENTOLIN HFA) 108 (90 Base) MCG/ACT inhaler Inhale 2 puffs into the lungs every 2 (two) hours as needed for wheezing or shortness of breath (cough). 11/13/17  Yes YDrenda Freeze MD  citalopram (CELEXA) 40 MG tablet Take 40 mg by mouth daily. 05/20/20  Yes [provider]  fluticasone (FLONASE) 50 MCG/ACT nasal spray Place into both nostrils. 07/06/21  Yes [provider]  guaiFENesin (MUCINEX) 600 MG 12 hr tablet Take 1  tablet (600 mg total) by mouth 2 (two) times daily. 01/08/21  Yes Sharen Hones, MD  lidocaine (LIDODERM) 5 % Place 1 patch onto the skin daily. Remove & Discard patch within 12 hours or as directed by MD 01/08/21  Yes Sharen Hones, MD  montelukast (SINGULAIR) 10 MG tablet Take 1 tablet by mouth at bedtime. 01/04/20  Yes [provider]  SPIRIVA HANDIHALER 18 MCG inhalation capsule 1 capsule daily. 07/06/21  Yes [provider]    Allergies: Allergies  Allergen Reactions   Latex Rash    Social History:  reports that she quit smoking  about 13 years ago. Her smoking use included cigarettes. She has been exposed to tobacco smoke. She has never used smokeless tobacco. She reports that she does not currently use alcohol. She reports that she does not use drugs.   Family History: Family History  Problem Relation Age of Onset   Hypertension Mother    Cancer Father    Cancer Sister        Skin cancer   Breast cancer Maternal Grandmother    Breast cancer Cousin     Review of Systems: Review of Systems  Constitutional:  Negative for chills and fever.  HENT:  Negative for hearing loss.   Respiratory:  Negative for shortness of breath.   Cardiovascular:  Negative for chest pain.  Gastrointestinal:  Negative for abdominal pain, nausea and vomiting.  Genitourinary:  Negative for dysuria.  Musculoskeletal:  Negative for myalgias.  Skin:  Negative for rash.  Neurological:  Negative for dizziness.  Psychiatric/Behavioral:  Negative for depression.     Physical Exam BP (!) 144/82   Pulse 88   Temp 98.1 F (36.7 C) (Oral)   Ht '5\' 4"'  (1.626 m)   Wt 239 lb 3.2 oz (108.5 kg)   SpO2 95%   BMI 41.06 kg/m  CONSTITUTIONAL: No acute distress HEENT:  Normocephalic, atraumatic, extraocular motion intact. NECK: Trachea is midline, and there is no jugular venous distension.  RESPIRATORY:  Lungs are clear, and breath sounds are equal bilaterally. Normal respiratory effort without pathologic use of accessory muscles.  Currently not wearing oxygen but she has the machine with her. CARDIOVASCULAR: Heart is regular without murmurs, gallops, or rubs. BREAST: Left breast with biopsy site with some mild ecchymosis but otherwise no other changes.  No palpable masses, skin changes, or nipple changes.  Left axilla without any palpable lymphadenopathy was a biopsy site is present with also minimal ecchymosis.  On the right breast, there are no palpable masses, skin changes, or nipple changes.  No right axillary lymphadenopathy. MUSCULOSKELETAL:   Normal muscle strength and tone in all four extremities.  No peripheral edema or cyanosis. SKIN: Skin turgor is normal. There are no pathologic skin lesions.  NEUROLOGIC:  Motor and sensation is grossly normal.  Cranial nerves are grossly intact. PSYCH:  Alert and oriented to person, place and time. Affect is normal.  Laboratory Analysis: Left breast and axillary biopsy on 07/28/2021: DIAGNOSIS:  A. BREAST, LEFT AT 230 O'CLOCK, 6 CM FROM THE NIPPLE; ULTRASOUND-GUIDED CORE NEEDLE BIOPSY:  - INVASIVE MAMMARY CARCINOMA, NO SPECIAL TYPE.  Size of invasive carcinoma: 12 mm in this sample  Histologic grade of invasive carcinoma: Grade 3                       Glandular/tubular differentiation score: 3  Nuclear pleomorphism score: 3                       Mitotic rate score: 3                       Total score: 9  Ductal carcinoma in situ: Not identified  Lymphovascular invasion: Not identified   B. LYMPH NODE, LEFT AXILLARY; ULTRASOUND-GUIDED CORE NEEDLE BIOPSY:  - POSITIVE FOR MALIGNANCY.  - MACROMETASTATIC MAMMARY CARCINOMA, 13 MM IN GREATEST LINEAR EXTENT.   Estrogen Receptor (ER) Status: POSITIVE          Percentage of cells with nuclear positivity: Greater than 90%          Average intensity of staining: Strong   Progesterone Receptor (PgR) Status: POSITIVE          Percentage of cells with nuclear positivity: 11-20%          Average intensity of staining: Moderate   HER2 (by immunohistochemistry): NEGATIVE (Score 0)  Ki-67: Not performed   Imaging: Bilateral diagnostic mammogram and left ultrasound on 07/10/2021: FINDINGS: An irregular, spiculated hyperdense mass is demonstrated in the upper outer left breast at posterior depth. Otherwise, no new or suspicious findings in either breast. The remainder of the parenchymal pattern is stable.   Targeted ultrasound is performed, showing an irregular, hypoechoic mass with associated vascularity at the 2:30 position  6 cm from the nipple. It measures 1.8 x 1.4 x 1.7 cm. This correlates with the mammographic finding. A single morphologically abnormal lymph node is noted in the left axilla. It demonstrates 6 mm of diffuse cortical thickening.   IMPRESSION: 1. Highly suspicious left breast mass at the 2:30 position. Recommendation is for ultrasound-guided biopsy. 2. Suspicious left axillary lymphadenopathy. Recommendation is for ultrasound-guided biopsy. 3. No mammographic evidence of malignancy on the right.   RECOMMENDATION: Two area ultrasound-guided biopsy of the left breast and axilla.   I have discussed the findings and recommendations with the patient. If applicable, a reminder letter will be sent to the patient regarding the next appointment.   BI-RADS CATEGORY  5: Highly suggestive of malignancy.  Assessment and Plan: This is a 71 y.o. female with left breast cancer with metastasis to a left axillary lymph node.  - Discussed with the patient the findings on her ultrasound and mammograms and that the site of this diagnosed cancer corresponds to the site of the previously suspected complicated cyst in 1610.  Unfortunately pathology is now positive for cancer and it has spread to at least 1 lymph node.  The patient has seen Dr. Janese Banks already and she has recommended upfront lumpectomy with sentinel lymph node biopsy. -I discussed with the patient surgical plan as recommended which would be a left breast RF tag localized lumpectomy and left axillary RF tag localized sentinel lymph node biopsy.  Discussed with her the need for RF tags both at the left breast mass as well as the left axillary lymph node that was positive for cancer so that during surgery we can make sure that both areas are fully excised.  Discussed with her that the tag placement would be done preoperatively at the [facility] and reviewed with her the different steps on the day of surgery including going to nuclear medicine for  radioactive tracer injection in the left nipple, intraoperative injection of methylene blue into the left nipple for further assistance and identification of other sentinel lymph nodes and the use of  the radiofrequency probe to identify the tags and localize these areas better.  Reviewed with her the surgery at length including the risks of bleeding, infection, injury to surrounding structures, the risk of lymphedema, postoperative activity restrictions, the use of a breast binder postoperatively for 2 weeks, and she is willing to proceed. - We will tentatively schedule the patient for surgery on 08/22/2021 pending scheduling of the RF tag placement.  We will also send for pulmonary clearance to Dr. Raul Del who is her pulmonologist to see if the patient would be acceptable for general anesthesia.  If not, discussed with the patient that we may have an option for a nerve block to help [place] with pain control and then conscious sedation for the surgery itself. - Patient understands this plan and all of her questions have been answered.  I spent 80 minutes dedicated to the care of this patient on the date of this encounter to include pre-visit review of records, face-to-face time with the patient discussing diagnosis and management, and any post-visit coordination of care.   Melvyn Neth, [facility]

## 2021-08-11 NOTE — Progress Notes (Addendum)
08/11/2021  Reason for Visit: Left breast cancer with metastasis to axillary lymph node  Requesting Provider: Casper Harrison, RN  History of Present Illness: Marie Fernandez is a 71 y.o. female presenting for evaluation of left breast cancer.  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 Tamala Julian in 2015.  Her last mammogram had been in 01/09/2018 which showed a left breast mass at 2:30 position, 6 cm from the nipple measuring 4 x 3 x 4 mm consistent with a mildly complicated cyst.  55-monthfollow-up is recommended but the patient did not follow-up due to the [place] as well as the death of her son in a car accident.  She had her more recent studies on 07/10/2021 which consisted of diagnostic bilateral mammogram and left ultrasound which showed that now this mass measured 1.8 x 1.4 x 1.7 cm and was irregular and hypoechoic.  In the axilla, a single abnormal lymph node was noted with 6 mm of diffuse cortical thickening.  Both of these were biopsied on 07/28/2021 were positive for invasive breast cancer, ER/PR positive and HER2 negative.  The lymph node was positive with macrometastatic mammary carcinoma, 13 mm in greatest linear extent.  The patient has seen Dr. RJanese Bankson 08/07/2021 and she has recommended upfront lumpectomy with sentinel lymph node biopsy.  The patient has a family history of breast cancer in her maternal grandmother and cousin.  Her first menstrual cycle was at age 71  She has had 3 children which were breast-fed.  She did go through menopause at age 71  She did not feel any masses initially although currently she can feel the area at the biopsy site.  Denies any prior skin changes, or nipple changes.  Of note, the patient has severe COPD and uses oxygen at home particularly when she is ambulating.  She is on Advair and Spiriva as well as albuterol.  Past Medical History: Past Medical History:  Diagnosis Date   Asthma    Breast cancer ([place] 2015   left  breast cancer ADH LCIS FEA   COPD (chronic obstructive pulmonary disease) [place]    Skin cancer    squammous cell carcinoma     Past Surgical History: Past Surgical History:  Procedure Laterality Date   BREAST BIOPSY Left 2015   stereo biopsy   BREAST BIOPSY Left 07/28/2021   [place] Bx 2:30 6cmfn Heart clip, hydro clip in axilla, path pending   BREAST EXCISIONAL BIOPSY Left 2015   revealing ADH, FEA, LCIS   BREAST LUMPECTOMY Left 08/21/2013   ADH, FEA, LCIS   COLONOSCOPY WITH PROPOFOL N/A 12/24/2017   Procedure: COLONOSCOPY WITH PROPOFOL;  Surgeon: AJonathon Bellows MD;  [facility];  Service: Gastroenterology;  Laterality: N/A;    Home Medications: Prior to Admission medications   Medication Sig Start Date End Date Taking? Authorizing Provider  ADVAIR DISKUS 500-50 MCG/ACT AEPB Inhale 1 puff into the lungs 2 (two) times daily. 11/28/20  Yes [provider]  albuterol (PROVENTIL HFA;VENTOLIN HFA) 108 (90 Base) MCG/ACT inhaler Inhale 2 puffs into the lungs every 2 (two) hours as needed for wheezing or shortness of breath (cough). 11/13/17  Yes YDrenda Freeze MD  citalopram (CELEXA) 40 MG tablet Take 40 mg by mouth daily. 05/20/20  Yes [provider]  fluticasone (FLONASE) 50 MCG/ACT nasal spray Place into both nostrils. 07/06/21  Yes [provider]  guaiFENesin (MUCINEX) 600 MG 12 hr tablet Take 1  tablet (600 mg total) by mouth 2 (two) times daily. 01/08/21  Yes Sharen Hones, MD  lidocaine (LIDODERM) 5 % Place 1 patch onto the skin daily. Remove & Discard patch within 12 hours or as directed by MD 01/08/21  Yes Sharen Hones, MD  montelukast (SINGULAIR) 10 MG tablet Take 1 tablet by mouth at bedtime. 01/04/20  Yes [provider]  SPIRIVA HANDIHALER 18 MCG inhalation capsule 1 capsule daily. 07/06/21  Yes [provider]    Allergies: Allergies  Allergen Reactions   Latex Rash    Social History:  reports that she quit smoking  about 13 years ago. Her smoking use included cigarettes. She has been exposed to tobacco smoke. She has never used smokeless tobacco. She reports that she does not currently use alcohol. She reports that she does not use drugs.   Family History: Family History  Problem Relation Age of Onset   Hypertension Mother    Cancer Father    Cancer Sister        Skin cancer   Breast cancer Maternal Grandmother    Breast cancer Cousin     Review of Systems: Review of Systems  Constitutional:  Negative for chills and fever.  HENT:  Negative for hearing loss.   Respiratory:  Negative for shortness of breath.   Cardiovascular:  Negative for chest pain.  Gastrointestinal:  Negative for abdominal pain, nausea and vomiting.  Genitourinary:  Negative for dysuria.  Musculoskeletal:  Negative for myalgias.  Skin:  Negative for rash.  Neurological:  Negative for dizziness.  Psychiatric/Behavioral:  Negative for depression.     Physical Exam BP (!) 144/82   Pulse 88   Temp 98.1 F (36.7 C) (Oral)   Ht '5\' 4"'  (1.626 m)   Wt 239 lb 3.2 oz (108.5 kg)   SpO2 95%   BMI 41.06 kg/m  CONSTITUTIONAL: No acute distress HEENT:  Normocephalic, atraumatic, extraocular motion intact. NECK: Trachea is midline, and there is no jugular venous distension.  RESPIRATORY:  Lungs are clear, and breath sounds are equal bilaterally. Normal respiratory effort without pathologic use of accessory muscles.  Currently not wearing oxygen but she has the machine with her. CARDIOVASCULAR: Heart is regular without murmurs, gallops, or rubs. BREAST: Left breast with biopsy site with some mild ecchymosis but otherwise no other changes.  No palpable masses, skin changes, or nipple changes.  Left axilla without any palpable lymphadenopathy was a biopsy site is present with also minimal ecchymosis.  On the right breast, there are no palpable masses, skin changes, or nipple changes.  No right axillary lymphadenopathy. MUSCULOSKELETAL:   Normal muscle strength and tone in all four extremities.  No peripheral edema or cyanosis. SKIN: Skin turgor is normal. There are no pathologic skin lesions.  NEUROLOGIC:  Motor and sensation is grossly normal.  Cranial nerves are grossly intact. PSYCH:  Alert and oriented to person, place and time. Affect is normal.  Laboratory Analysis: Left breast and axillary biopsy on 07/28/2021: DIAGNOSIS:  A. BREAST, LEFT AT 230 O'CLOCK, 6 CM FROM THE NIPPLE; ULTRASOUND-GUIDED CORE NEEDLE BIOPSY:  - INVASIVE MAMMARY CARCINOMA, NO SPECIAL TYPE.  Size of invasive carcinoma: 12 mm in this sample  Histologic grade of invasive carcinoma: Grade 3                       Glandular/tubular differentiation score: 3  Nuclear pleomorphism score: 3                       Mitotic rate score: 3                       Total score: 9  Ductal carcinoma in situ: Not identified  Lymphovascular invasion: Not identified   B. LYMPH NODE, LEFT AXILLARY; ULTRASOUND-GUIDED CORE NEEDLE BIOPSY:  - POSITIVE FOR MALIGNANCY.  - MACROMETASTATIC MAMMARY CARCINOMA, 13 MM IN GREATEST LINEAR EXTENT.   Estrogen Receptor (ER) Status: POSITIVE          Percentage of cells with nuclear positivity: Greater than 90%          Average intensity of staining: Strong   Progesterone Receptor (PgR) Status: POSITIVE          Percentage of cells with nuclear positivity: 11-20%          Average intensity of staining: Moderate   HER2 (by immunohistochemistry): NEGATIVE (Score 0)  Ki-67: Not performed   Imaging: Bilateral diagnostic mammogram and left ultrasound on 07/10/2021: FINDINGS: An irregular, spiculated hyperdense mass is demonstrated in the upper outer left breast at posterior depth. Otherwise, no new or suspicious findings in either breast. The remainder of the parenchymal pattern is stable.   Targeted ultrasound is performed, showing an irregular, hypoechoic mass with associated vascularity at the 2:30 position  6 cm from the nipple. It measures 1.8 x 1.4 x 1.7 cm. This correlates with the mammographic finding. A single morphologically abnormal lymph node is noted in the left axilla. It demonstrates 6 mm of diffuse cortical thickening.   IMPRESSION: 1. Highly suspicious left breast mass at the 2:30 position. Recommendation is for ultrasound-guided biopsy. 2. Suspicious left axillary lymphadenopathy. Recommendation is for ultrasound-guided biopsy. 3. No mammographic evidence of malignancy on the right.   RECOMMENDATION: Two area ultrasound-guided biopsy of the left breast and axilla.   I have discussed the findings and recommendations with the patient. If applicable, a reminder letter will be sent to the patient regarding the next appointment.   BI-RADS CATEGORY  5: Highly suggestive of malignancy.  Assessment and Plan: This is a 71 y.o. female with left breast cancer with metastasis to a left axillary lymph node.  - Discussed with the patient the findings on her ultrasound and mammograms and that the site of this diagnosed cancer corresponds to the site of the previously suspected complicated cyst in 5732.  Unfortunately pathology is now positive for cancer and it has spread to at least 1 lymph node.  The patient has seen Dr. Janese Banks already and she has recommended upfront lumpectomy with sentinel lymph node biopsy. -I discussed with the patient surgical plan as recommended which would be a left breast RF tag localized lumpectomy and left axillary RF tag localized sentinel lymph node biopsy.  Discussed with her the need for RF tags both at the left breast mass as well as the left axillary lymph node that was positive for cancer so that during surgery we can make sure that both areas are fully excised.  Discussed with her that the tag placement would be done preoperatively at the [facility] and reviewed with her the different steps on the day of surgery including going to nuclear medicine for  radioactive tracer injection in the left nipple, intraoperative injection of methylene blue into the left nipple for further assistance and identification of other sentinel lymph nodes and the use of  the radiofrequency probe to identify the tags and localize these areas better.  Reviewed with her the surgery at length including the risks of bleeding, infection, injury to surrounding structures, the risk of lymphedema, postoperative activity restrictions, the use of a breast binder postoperatively for 2 weeks, and she is willing to proceed. - We will tentatively schedule the patient for surgery on 08/22/2021 pending scheduling of the RF tag placement.  We will also send for pulmonary clearance to Dr. Raul Del who is her pulmonologist to see if the patient would be acceptable for general anesthesia.  If not, discussed with the patient that we may have an option for a nerve block to help [place] with pain control and then conscious sedation for the surgery itself. - Patient understands this plan and all of her questions have been answered.  I spent 80 minutes dedicated to the care of this patient on the date of this encounter to include pre-visit review of records, face-to-face time with the patient discussing diagnosis and management, and any post-visit coordination of care.   Melvyn Neth, [facility]

## 2021-08-11 NOTE — Telephone Encounter (Signed)
Pulmonary Clearance faxed to Dr.Herbon Raul Del at this time.

## 2021-08-14 ENCOUNTER — Other Ambulatory Visit: Payer: Self-pay

## 2021-08-14 DIAGNOSIS — Z171 Estrogen receptor negative status [ER-]: Secondary | ICD-10-CM

## 2021-08-15 ENCOUNTER — Other Ambulatory Visit: Payer: Self-pay | Admitting: Surgery

## 2021-08-15 DIAGNOSIS — R928 Other abnormal and inconclusive findings on diagnostic imaging of breast: Secondary | ICD-10-CM

## 2021-08-15 DIAGNOSIS — N63 Unspecified lump in unspecified breast: Secondary | ICD-10-CM

## 2021-08-16 ENCOUNTER — Telehealth: Payer: Self-pay | Admitting: Surgery

## 2021-08-16 ENCOUNTER — Encounter
Admission: RE | Admit: 2021-08-16 | Discharge: 2021-08-16 | Disposition: A | Discharge status: Home / Self Care | Payer: Medicare HMO | Source: Ambulatory Visit | Attending: Surgery | Admitting: Surgery

## 2021-08-16 DIAGNOSIS — E871 Hypo-osmolality and hyponatremia: Secondary | ICD-10-CM

## 2021-08-16 DIAGNOSIS — E876 Hypokalemia: Secondary | ICD-10-CM

## 2021-08-16 DIAGNOSIS — J441 Chronic obstructive pulmonary disease with (acute) exacerbation: Secondary | ICD-10-CM

## 2021-08-16 DIAGNOSIS — Z01818 Encounter for other preprocedural examination: Secondary | ICD-10-CM

## 2021-08-16 HISTORY — DX: Acute respiratory failure with hypoxia: J96.01

## 2021-08-16 HISTORY — DX: Obesity, unspecified: E66.9

## 2021-08-16 HISTORY — DX: Hypo-osmolality and hyponatremia: E87.1

## 2021-08-16 HISTORY — DX: Hypokalemia: E87.6

## 2021-08-16 NOTE — Patient Instructions (Signed)
Your procedure is scheduled on:08-29-21 Tuesday Report to the Registration Desk on the 1st floor of the [facility]. Then proceed to the Radiology Desk (2nd desk on right) Arrive at 12:30 pm  REMEMBER: Instructions that are not followed completely may result in serious medical risk, up to and including death; or upon the discretion of your surgeon and anesthesiologist your surgery may need to be rescheduled.  Do not eat food after midnight the night before surgery.  No gum chewing, lozengers or hard candies.  You may however, drink CLEAR liquids up to 2 hours before you are scheduled to arrive for your surgery. Do not drink anything within 2 hours of your scheduled arrival time.  Clear liquids include: - water  - apple juice without pulp - gatorade (not RED colors) - black coffee or tea (Do NOT add milk or creamers to the coffee or tea) Do NOT drink anything that is not on this list  Do NOT take any medication the day of surgery  Use your Albuterol Nebulizer the day of surgery and your Advair Inhaler and bring your Albuterol Inhaler to the hospital  One week prior to surgery: Stop Anti-inflammatories (NSAIDS) such as Advil, Aleve, Ibuprofen, Motrin, Naproxen, Naprosyn and Aspirin based products such as Excedrin, Goodys Powder, BC Powder.You may however, take Tylenol if needed for pain up until the day of surgery.  Stop ANY OVER THE COUNTER supplements/vitamins 7 days prior to surgery  No Alcohol for 24 hours before or after surgery.  No Smoking including e-cigarettes for 24 hours prior to surgery.  No chewable tobacco products for at least 6 hours prior to surgery.  No nicotine patches on the day of surgery.  Do not use any "recreational" drugs for at least a week prior to your surgery.  Please be advised that the combination of cocaine and anesthesia may have negative outcomes, up to and including death. If you test positive for cocaine, your surgery will be cancelled.  On the  morning of surgery brush your teeth with toothpaste and water, you may rinse your mouth with mouthwash if you wish. Do not swallow any toothpaste or mouthwash.  Use CHG Soap as directed on instruction sheet.  Do not wear jewelry, make-up, hairpins, clips or nail polish.  Do not wear lotions, powders, or perfumes.   Do not shave body from the neck down 48 hours prior to surgery just in case you cut yourself which could leave a site for infection.  Also, freshly shaved skin may become irritated if using the CHG soap.  Contact lenses, hearing aids and dentures may not be worn into surgery.  Do not bring valuables to the hospital. [facility] is not responsible for any missing/lost belongings or valuables.   Notify your doctor if there is any change in your medical condition (cold, fever, infection).  Wear comfortable clothing (specific to your surgery type) to the hospital.  After surgery, you can help prevent lung complications by doing breathing exercises.  Take deep breaths and cough every 1-2 hours. Your doctor may order a device called an Incentive Spirometer to help you take deep breaths. When coughing or sneezing, hold a pillow firmly against your incision with both hands. This is called "splinting." Doing this helps protect your incision. It also decreases belly discomfort.  If you are being admitted to the hospital overnight, leave your suitcase in the car. After surgery it may be brought to your room.  If you are being discharged the day of surgery,  you will not be allowed to drive home. You will need a responsible adult (18 years or older) to drive you home and stay with you that night.   If you are taking public transportation, you will need to have a responsible adult (18 years or older) with you. Please confirm with your physician that it is acceptable to use public transportation.   Please call the [facility]. at [phone] if you have any questions  about these instructions.  Surgery Visitation Policy:  Patients undergoing a surgery or procedure may have two family members or support persons with them as long as the person is not COVID-19 positive or experiencing its symptoms.

## 2021-08-16 NOTE — Telephone Encounter (Signed)
Outgoing call is made, left message for patient to call.  Please inform her of the following regarding scheduled surgery:   Pre-Admission date/time, and Surgery date.  Surgery Date: 08/29/21, patient to arrive @ 12:30 at [facility] as will be having SLN injection done first before her surgery.   Preadmission Testing Date: 08/16/21 (phone 1p-5p)  Also remind patient of her RF tag placement at the [facility] on 08/22/21.

## 2021-08-18 ENCOUNTER — Encounter
Admission: RE | Admit: 2021-08-18 | Discharge: 2021-08-18 | Disposition: A | Discharge status: Home / Self Care | Payer: Medicare HMO | Source: Ambulatory Visit | Attending: Surgery | Admitting: Surgery

## 2021-08-18 DIAGNOSIS — Z01818 Encounter for other preprocedural examination: Secondary | ICD-10-CM | POA: Diagnosis present

## 2021-08-18 DIAGNOSIS — J441 Chronic obstructive pulmonary disease with (acute) exacerbation: Secondary | ICD-10-CM

## 2021-08-18 DIAGNOSIS — E876 Hypokalemia: Secondary | ICD-10-CM | POA: Diagnosis not present

## 2021-08-18 DIAGNOSIS — E871 Hypo-osmolality and hyponatremia: Secondary | ICD-10-CM | POA: Diagnosis not present

## 2021-08-18 LAB — BASIC METABOLIC PANEL
Anion gap: 5 (ref 5–15)
BUN: 10 mg/dL (ref 8–23)
CO2: 25 mmol/L (ref 22–32)
Calcium: 8.9 mg/dL (ref 8.9–10.3)
Chloride: 109 mmol/L (ref 98–111)
Creatinine, Ser: 0.79 mg/dL (ref 0.44–1.00)
GFR, Estimated: >60 mL/min (ref >60)
Glucose, Bld: 114 mg/dL — ABNORMAL HIGH (ref 70–99)
Potassium: 3.6 mmol/L (ref 3.5–5.1)
Sodium: 139 mmol/L (ref 135–145)

## 2021-08-18 LAB — CBC
HCT: 42.5 % (ref 36.0–46.0)
Hemoglobin: 13.8 g/dL (ref 12.0–15.0)
MCH: 28 pg (ref 26.0–34.0)
MCHC: 32.5 g/dL (ref 30.0–36.0)
MCV: 86.4 fL (ref 80.0–100.0)
Platelets: 203 10*3/uL (ref 150–400)
RBC: 4.92 MIL/uL (ref 3.87–5.11)
RDW: 14.1 % (ref 11.5–15.5)
WBC: 7.5 10*3/uL (ref 4.0–10.5)
nRBC: 0 % (ref 0.0–0.2)

## 2021-08-18 NOTE — Telephone Encounter (Signed)
Outgoing call is made again, left message for patient to call so that we can give her arrival time for surgery.

## 2021-08-21 ENCOUNTER — Telehealth: Payer: Self-pay

## 2021-08-21 NOTE — Telephone Encounter (Signed)
Received pulmonary clearance from Dr. Raul Del. Pt's risk assessment is high. Prefers local anesthesia.

## 2021-08-21 NOTE — Telephone Encounter (Signed)
Patient calls back, she is now informed of all dates regarding her surgery and verbalized understanding.

## 2021-08-21 NOTE — Progress Notes (Signed)
  Perioperative Services Pre-Admission/Anesthesia Testing    Date: 08/21/21  Name: Marie Fernandez MRN:   [id number]  Re: Pulmonary clearance for surgery  Planned Surgical Procedure(s):    Case: [id number] Date/Time: 08/29/21 1400   Procedures:      BREAST LUMPECTOMY WITH RADIOFREQUENCY TAG IDENTIFICATION (Left)     AXILLARY SENTINEL NODE BIOPSY (Left)   Anesthesia type: General   Pre-op diagnosis: left breast cancer   Location: [facility] OR ROOM 06 / [facility]   Surgeons: Olean Ree, MD   Clinical Notes:  Patient is scheduled for the above procedure on 08/29/2021 with Dr. Ardath Sax, MD.  Patient has a significant respiratory history.  She is on chronic supplemental oxygen.  She is followed by pulmonary medicine Raul Del, MD).  Last PFTs were performed in 03/2021, and results are as follows.  SPIROMETRY: FVC was 1.57 Liters, 63% of predicted/Post 1.46 Liters, -7% Change FEV1 was 0.88 Liters, 45% of predicted/Post 0.89 Liters, 1% Change FEV1 ratio was 55.59%, 70% of predicted/Post 61.16%, 10% Change FEF 25-75% Liters per second was 0.41, 21% of predicted/Post 0.47, 13% Change *Patient took Personal Albuterol Inhaler for Post Spirometry DIFFUSION CAPACITY: DLCO was 14.32 mL/min/mmHg VA was 3.22 Liters DLCO/VA was 4.45 mL/min/mmHg/L LUNG VOLUMES: TLC was 4.61 Liters RV was 3.04 Liters FLOW VOLUME LOOP: Expiratory flow volume loop is delayed  IMPRESSIONS: Spirometry is c/w severe-very severe obstruction, no significant bronchodilator Lung volumes c/w hyperinflation Diffusion capacity is in normal range   Per pulmonary medicine, "patient is optimized for surgery.  Prefer that procedure be performed under local anesthesia if possible.  She is at an overall HIGH RISK of significant cardiopulmonary complications during her perioperative course".  Honor Loh, MSN, APRN, FNP-C, CEN [facility]  Peri-operative Services Nurse Practitioner Phone:  [phone] 08/21/21 3:28 PM  NOTE: This note has been prepared using Dragon dictation software. Despite my best ability to proofread, there is always the potential that unintentional transcriptional errors may still occur from this process.

## 2021-08-22 ENCOUNTER — Inpatient Hospital Stay: Admission: RE | Admit: 2021-08-22 | Payer: Medicare HMO | Source: Ambulatory Visit

## 2021-08-22 ENCOUNTER — Ambulatory Visit
Admission: RE | Admit: 2021-08-22 | Discharge: 2021-08-22 | Disposition: A | Discharge status: Home / Self Care | Payer: Medicare HMO | Source: Ambulatory Visit | Attending: Surgery | Admitting: Surgery

## 2021-08-22 ENCOUNTER — Other Ambulatory Visit: Payer: Medicare HMO

## 2021-08-22 DIAGNOSIS — R928 Other abnormal and inconclusive findings on diagnostic imaging of breast: Secondary | ICD-10-CM | POA: Insufficient documentation

## 2021-08-22 DIAGNOSIS — N63 Unspecified lump in unspecified breast: Secondary | ICD-10-CM

## 2021-08-22 HISTORY — PX: BREAST LUMPECTOMY WITH RADIOFREQUENCY TAG IDENTIFICATION: SHX6884

## 2021-08-29 ENCOUNTER — Other Ambulatory Visit: Payer: Self-pay

## 2021-08-29 ENCOUNTER — Ambulatory Visit
Admission: RE | Admit: 2021-08-29 | Discharge: 2021-08-29 | Disposition: A | Discharge status: Home / Self Care | Payer: Medicare HMO | Source: Ambulatory Visit | Attending: Surgery | Admitting: Surgery

## 2021-08-29 ENCOUNTER — Ambulatory Visit: Payer: Medicare HMO | Admitting: Urgent Care

## 2021-08-29 ENCOUNTER — Encounter
Admission: RE | Disposition: A | Discharge status: Home / Self Care | Payer: Self-pay | Source: Ambulatory Visit | Attending: Surgery

## 2021-08-29 ENCOUNTER — Ambulatory Visit: Payer: Medicare HMO

## 2021-08-29 ENCOUNTER — Encounter: Payer: Self-pay | Admitting: Surgery

## 2021-08-29 DIAGNOSIS — C773 Secondary and unspecified malignant neoplasm of axilla and upper limb lymph nodes: Secondary | ICD-10-CM | POA: Diagnosis not present

## 2021-08-29 DIAGNOSIS — C50412 Malignant neoplasm of upper-outer quadrant of left female breast: Secondary | ICD-10-CM | POA: Insufficient documentation

## 2021-08-29 DIAGNOSIS — Z9981 Dependence on supplemental oxygen: Secondary | ICD-10-CM | POA: Insufficient documentation

## 2021-08-29 DIAGNOSIS — J449 Chronic obstructive pulmonary disease, unspecified: Secondary | ICD-10-CM | POA: Diagnosis not present

## 2021-08-29 DIAGNOSIS — Z170 Estrogen receptor positive status [ER+]: Secondary | ICD-10-CM | POA: Diagnosis not present

## 2021-08-29 DIAGNOSIS — R928 Other abnormal and inconclusive findings on diagnostic imaging of breast: Secondary | ICD-10-CM

## 2021-08-29 DIAGNOSIS — I10 Essential (primary) hypertension: Secondary | ICD-10-CM | POA: Diagnosis not present

## 2021-08-29 DIAGNOSIS — Z803 Family history of malignant neoplasm of breast: Secondary | ICD-10-CM | POA: Diagnosis not present

## 2021-08-29 DIAGNOSIS — Z7951 Long term (current) use of inhaled steroids: Secondary | ICD-10-CM | POA: Insufficient documentation

## 2021-08-29 DIAGNOSIS — Z87891 Personal history of nicotine dependence: Secondary | ICD-10-CM | POA: Diagnosis not present

## 2021-08-29 DIAGNOSIS — Z6841 Body Mass Index (BMI) 40.0 and over, adult: Secondary | ICD-10-CM | POA: Insufficient documentation

## 2021-08-29 DIAGNOSIS — Z01812 Encounter for preprocedural laboratory examination: Secondary | ICD-10-CM

## 2021-08-29 DIAGNOSIS — Z171 Estrogen receptor negative status [ER-]: Secondary | ICD-10-CM

## 2021-08-29 DIAGNOSIS — N63 Unspecified lump in unspecified breast: Secondary | ICD-10-CM

## 2021-08-29 HISTORY — PX: AXILLARY SENTINEL NODE BIOPSY: SHX5738

## 2021-08-29 HISTORY — PX: BREAST LUMPECTOMY WITH RADIOFREQUENCY TAG IDENTIFICATION: SHX6884

## 2021-08-29 SURGERY — BREAST LUMPECTOMY WITH RADIOFREQUENCY TAG IDENTIFICATION
Anesthesia: General | Site: Breast | Laterality: Left

## 2021-08-29 MED ORDER — SODIUM CHLORIDE (PF) 0.9 % IJ SOLN
INTRAMUSCULAR | Status: AC
  Filled 2021-08-29: qty 20

## 2021-08-29 MED ORDER — FENTANYL CITRATE (PF) 100 MCG/2ML IJ SOLN
INTRAMUSCULAR | Status: AC
  Filled 2021-08-29: qty 2

## 2021-08-29 MED ORDER — KETOROLAC TROMETHAMINE 30 MG/ML IJ SOLN
INTRAMUSCULAR | Status: AC
  Filled 2021-08-29: qty 1

## 2021-08-29 MED ORDER — PROPOFOL 10 MG/ML IV BOLUS
INTRAVENOUS | Status: DC | PRN
  Administered 2021-08-29: 150 mg via INTRAVENOUS

## 2021-08-29 MED ORDER — ACETAMINOPHEN 500 MG PO TABS
ORAL_TABLET | ORAL | Status: AC
  Administered 2021-08-29: 1000 mg via ORAL
  Filled 2021-08-29: qty 2

## 2021-08-29 MED ORDER — DEXMEDETOMIDINE (PRECEDEX) IN NS 20 MCG/5ML (4 MCG/ML) IV SYRINGE
PREFILLED_SYRINGE | INTRAVENOUS | Status: DC | PRN
  Administered 2021-08-29 (×2): 8 ug via INTRAVENOUS

## 2021-08-29 MED ORDER — BUPIVACAINE HCL (PF) 0.5 % IJ SOLN
INTRAMUSCULAR | Status: AC
  Filled 2021-08-29: qty 20

## 2021-08-29 MED ORDER — DEXAMETHASONE SODIUM PHOSPHATE 10 MG/ML IJ SOLN
INTRAMUSCULAR | Status: DC | PRN
  Administered 2021-08-29: 10 mg via INTRAVENOUS

## 2021-08-29 MED ORDER — BUPIVACAINE-EPINEPHRINE (PF) 0.5% -1:200000 IJ SOLN
INTRAMUSCULAR | Status: AC
  Filled 2021-08-29: qty 30

## 2021-08-29 MED ORDER — ACETAMINOPHEN 500 MG PO TABS: 1000.0000 mg | ORAL_TABLET | Freq: Four times a day (QID) | ORAL | Status: AC | PRN

## 2021-08-29 MED ORDER — LIDOCAINE HCL (PF) 2 % IJ SOLN
INTRAMUSCULAR | Status: AC
  Filled 2021-08-29: qty 5

## 2021-08-29 MED ORDER — LIDOCAINE HCL (CARDIAC) PF 100 MG/5ML IV SOSY
PREFILLED_SYRINGE | INTRAVENOUS | Status: DC | PRN
  Administered 2021-08-29: 100 mg via INTRAVENOUS

## 2021-08-29 MED ORDER — ACETAMINOPHEN 10 MG/ML IV SOLN: 1000.0000 mg | Freq: Once | INTRAVENOUS | Status: DC | PRN

## 2021-08-29 MED ORDER — STERILE WATER FOR IRRIGATION IR SOLN
Status: DC | PRN
  Administered 2021-08-29: 1000 mL

## 2021-08-29 MED ORDER — CHLORHEXIDINE GLUCONATE CLOTH 2 % EX PADS: 6.0000 | MEDICATED_PAD | Freq: Once | CUTANEOUS | Status: DC

## 2021-08-29 MED ORDER — BUPIVACAINE LIPOSOME 1.3 % IJ SUSP
INTRAMUSCULAR | Status: AC
  Filled 2021-08-29: qty 10

## 2021-08-29 MED ORDER — LACTATED RINGERS IV SOLN: INTRAVENOUS | Status: DC

## 2021-08-29 MED ORDER — MIDAZOLAM HCL 2 MG/2ML IJ SOLN
INTRAMUSCULAR | Status: AC
  Filled 2021-08-29: qty 2

## 2021-08-29 MED ORDER — OXYCODONE HCL 5 MG PO TABS: 5.0000 mg | ORAL_TABLET | ORAL | 0 refills | Status: DC | PRN

## 2021-08-29 MED ORDER — BUPIVACAINE HCL (PF) 0.5 % IJ SOLN
INTRAMUSCULAR | Status: DC | PRN
  Administered 2021-08-29: 20 mL

## 2021-08-29 MED ORDER — CHLORHEXIDINE GLUCONATE 0.12 % MT SOLN
OROMUCOSAL | Status: AC
  Filled 2021-08-29: qty 15

## 2021-08-29 MED ORDER — METHYLENE BLUE 1 % INJ SOLN
INTRAVENOUS | Status: AC
  Filled 2021-08-29: qty 10

## 2021-08-29 MED ORDER — KETOROLAC TROMETHAMINE 30 MG/ML IJ SOLN
INTRAMUSCULAR | Status: DC | PRN
  Administered 2021-08-29: 30 mg via INTRAVENOUS

## 2021-08-29 MED ORDER — CHLORHEXIDINE GLUCONATE 0.12 % MT SOLN
15.0000 mL | Freq: Once | OROMUCOSAL | Status: AC
  Administered 2021-08-29: 15 mL via OROMUCOSAL

## 2021-08-29 MED ORDER — FENTANYL CITRATE PF 50 MCG/ML IJ SOSY
PREFILLED_SYRINGE | INTRAMUSCULAR | Status: AC
  Administered 2021-08-29: 50 ug
  Filled 2021-08-29: qty 1

## 2021-08-29 MED ORDER — FENTANYL CITRATE PF 50 MCG/ML IJ SOSY: 50.0000 ug | PREFILLED_SYRINGE | Freq: Once | INTRAMUSCULAR | Status: DC

## 2021-08-29 MED ORDER — DEXMEDETOMIDINE HCL IN NACL 80 MCG/20ML IV SOLN
INTRAVENOUS | Status: AC
  Filled 2021-08-29: qty 20

## 2021-08-29 MED ORDER — FENTANYL CITRATE (PF) 100 MCG/2ML IJ SOLN
INTRAMUSCULAR | Status: DC | PRN
  Administered 2021-08-29 (×8): 25 ug via INTRAVENOUS

## 2021-08-29 MED ORDER — ONDANSETRON HCL 4 MG/2ML IJ SOLN
INTRAMUSCULAR | Status: DC | PRN
  Administered 2021-08-29: 4 mg via INTRAVENOUS

## 2021-08-29 MED ORDER — MIDAZOLAM HCL 2 MG/2ML IJ SOLN
INTRAMUSCULAR | Status: DC | PRN
  Administered 2021-08-29: 2 mg via INTRAVENOUS

## 2021-08-29 MED ORDER — FENTANYL CITRATE (PF) 100 MCG/2ML IJ SOLN
25.0000 ug | INTRAMUSCULAR | Status: DC | PRN
  Administered 2021-08-29 (×4): 25 ug via INTRAVENOUS

## 2021-08-29 MED ORDER — ONDANSETRON HCL 4 MG/2ML IJ SOLN
4.0000 mg | Freq: Once | INTRAMUSCULAR | Status: AC | PRN
Start: 2021-08-29 — End: 2021-08-29

## 2021-08-29 MED ORDER — FENTANYL CITRATE (PF) 100 MCG/2ML IJ SOLN
INTRAMUSCULAR | Status: AC
  Administered 2021-08-29: 50 ug via INTRAVENOUS
  Filled 2021-08-29: qty 2

## 2021-08-29 MED ORDER — CEFAZOLIN SODIUM-DEXTROSE 2-4 GM/100ML-% IV SOLN
2.0000 g | INTRAVENOUS | Status: AC
  Administered 2021-08-29: 2 g via INTRAVENOUS

## 2021-08-29 MED ORDER — BUPIVACAINE-EPINEPHRINE 0.5% -1:200000 IJ SOLN
INTRAMUSCULAR | Status: DC | PRN
  Administered 2021-08-29: 30 mL

## 2021-08-29 MED ORDER — ONDANSETRON HCL 4 MG/2ML IJ SOLN
INTRAMUSCULAR | Status: AC
  Administered 2021-08-29: 4 mg via INTRAVENOUS
  Filled 2021-08-29: qty 2

## 2021-08-29 MED ORDER — FAMOTIDINE 20 MG PO TABS
ORAL_TABLET | ORAL | Status: AC
  Administered 2021-08-29: 20 mg
  Filled 2021-08-29: qty 1

## 2021-08-29 MED ORDER — OXYCODONE HCL 5 MG/5ML PO SOLN: 5.0000 mg | Freq: Once | ORAL | Status: AC | PRN

## 2021-08-29 MED ORDER — GABAPENTIN 300 MG PO CAPS
ORAL_CAPSULE | ORAL | Status: AC
  Administered 2021-08-29: 300 mg via ORAL
  Filled 2021-08-29: qty 1

## 2021-08-29 MED ORDER — TECHNETIUM TC 99M TILMANOCEPT KIT
0.9990 | PACK | Freq: Once | INTRAVENOUS | Status: AC | PRN
  Administered 2021-08-29: 0.999 via INTRADERMAL

## 2021-08-29 MED ORDER — PHENYLEPHRINE 80 MCG/ML (10ML) SYRINGE FOR IV PUSH (FOR BLOOD PRESSURE SUPPORT)
PREFILLED_SYRINGE | INTRAVENOUS | Status: DC | PRN
  Administered 2021-08-29: 80 ug via INTRAVENOUS

## 2021-08-29 MED ORDER — ORAL CARE MOUTH RINSE: 15.0000 mL | Freq: Once | OROMUCOSAL | Status: AC

## 2021-08-29 MED ORDER — OXYCODONE HCL 5 MG PO TABS
5.0000 mg | ORAL_TABLET | Freq: Once | ORAL | Status: AC | PRN
  Administered 2021-08-29: 5 mg via ORAL

## 2021-08-29 MED ORDER — ACETAMINOPHEN 500 MG PO TABS: 1000.0000 mg | ORAL_TABLET | ORAL | Status: AC

## 2021-08-29 MED ORDER — DEXAMETHASONE SODIUM PHOSPHATE 10 MG/ML IJ SOLN
INTRAMUSCULAR | Status: AC
  Filled 2021-08-29: qty 1

## 2021-08-29 MED ORDER — GABAPENTIN 300 MG PO CAPS: 300.0000 mg | ORAL_CAPSULE | ORAL | Status: AC

## 2021-08-29 MED ORDER — METHYLENE BLUE 1 % INJ SOLN
INTRAVENOUS | Status: DC | PRN
  Administered 2021-08-29: 2.5 mL via SUBMUCOSAL

## 2021-08-29 MED ORDER — FAMOTIDINE 20 MG PO TABS: 20.0000 mg | ORAL_TABLET | Freq: Once | ORAL | Status: DC

## 2021-08-29 MED ORDER — CEFAZOLIN SODIUM-DEXTROSE 2-4 GM/100ML-% IV SOLN
INTRAVENOUS | Status: AC
  Filled 2021-08-29: qty 100

## 2021-08-29 MED ORDER — ONDANSETRON HCL 4 MG/2ML IJ SOLN
INTRAMUSCULAR | Status: AC
  Filled 2021-08-29: qty 2

## 2021-08-29 MED ORDER — IBUPROFEN 600 MG PO TABS: 600.0000 mg | ORAL_TABLET | Freq: Three times a day (TID) | ORAL | 1 refills | Status: DC | PRN

## 2021-08-29 MED ORDER — KETAMINE HCL 50 MG/5ML IJ SOSY
PREFILLED_SYRINGE | INTRAMUSCULAR | Status: AC
  Filled 2021-08-29: qty 5

## 2021-08-29 MED ORDER — BUPIVACAINE LIPOSOME 1.3 % IJ SUSP
INTRAMUSCULAR | Status: DC | PRN
  Administered 2021-08-29: 10 mL

## 2021-08-29 MED ORDER — OXYCODONE HCL 5 MG PO TABS
ORAL_TABLET | ORAL | Status: AC
  Filled 2021-08-29: qty 1

## 2021-08-29 MED ORDER — BUPIVACAINE LIPOSOME 1.3 % IJ SUSP: 20.0000 mL | Freq: Once | INTRAMUSCULAR | Status: DC

## 2021-08-29 SURGICAL SUPPLY — 63 items
APPLIER CLIP 11 MED OPEN (CLIP)
APPLIER CLIP 9.375 SM OPEN (CLIP)
BINDER BREAST LRG (GAUZE/BANDAGES/DRESSINGS) IMPLANT
BINDER BREAST MEDIUM (GAUZE/BANDAGES/DRESSINGS) IMPLANT
BINDER BREAST XLRG (GAUZE/BANDAGES/DRESSINGS) ×1 IMPLANT
BLADE PHOTON ILLUMINATED (MISCELLANEOUS) ×2 IMPLANT
BLADE SURG 15 STRL LF DISP TIS (BLADE) ×2 IMPLANT
BLADE SURG 15 STRL SS (BLADE) ×4
CHLORAPREP W/TINT 26 (MISCELLANEOUS) ×2 IMPLANT
CLIP APPLIE 11 MED OPEN (CLIP) IMPLANT
CLIP APPLIE 9.375 SM OPEN (CLIP) IMPLANT
CNTNR SPEC 2.5X3XGRAD LEK (MISCELLANEOUS) ×1
CONT SPEC 4OZ STER OR WHT (MISCELLANEOUS) ×1
CONT SPEC 4OZ STRL OR WHT (MISCELLANEOUS) ×1
CONTAINER SPEC 2.5X3XGRAD LEK (MISCELLANEOUS) ×1 IMPLANT
COVER PROBE FLX POLY STRL (MISCELLANEOUS) ×2 IMPLANT
COVER PROBE GAMMA FINDER SLV (MISCELLANEOUS) ×2 IMPLANT
DERMABOND ADVANCED (GAUZE/BANDAGES/DRESSINGS) ×1
DERMABOND ADVANCED .7 DNX12 (GAUZE/BANDAGES/DRESSINGS) ×1 IMPLANT
DEVICE DUBIN SPECIMEN MAMMOGRA (MISCELLANEOUS) ×2 IMPLANT
DRAPE LAPAROTOMY 100X77 ABD (DRAPES) ×2 IMPLANT
DRAPE LAPAROTOMY TRNSV 106X77 (MISCELLANEOUS) ×2 IMPLANT
DRSG GAUZE FLUFF 36X18 (GAUZE/BANDAGES/DRESSINGS) ×2 IMPLANT
ELECT CAUTERY BLADE TIP 2.5 (TIP) ×2
ELECT REM PT RETURN 9FT ADLT (ELECTROSURGICAL) ×2
ELECTRODE CAUTERY BLDE TIP 2.5 (TIP) ×1 IMPLANT
ELECTRODE REM PT RTRN 9FT ADLT (ELECTROSURGICAL) ×1 IMPLANT
GAUZE 4X4 16PLY ~~LOC~~+RFID DBL (SPONGE) ×2 IMPLANT
GLOVE SURG SYN 7.0 (GLOVE) ×2 IMPLANT
GLOVE SURG SYN 7.0 PF PI (GLOVE) ×1 IMPLANT
GLOVE SURG SYN 7.5  E (GLOVE) ×2
GLOVE SURG SYN 7.5 E (GLOVE) ×1 IMPLANT
GLOVE SURG SYN 7.5 PF PI (GLOVE) ×1 IMPLANT
GOWN STRL REUS W/ TWL LRG LVL3 (GOWN DISPOSABLE) ×2 IMPLANT
GOWN STRL REUS W/TWL LRG LVL3 (GOWN DISPOSABLE) ×4
KIT MARKER MARGIN INK (KITS) ×1 IMPLANT
KIT TURNOVER KIT A (KITS) ×2 IMPLANT
LABEL OR SOLS (LABEL) ×2 IMPLANT
MANIFOLD NEPTUNE II (INSTRUMENTS) ×2 IMPLANT
MARGIN MAP 10MM (MISCELLANEOUS) IMPLANT
MARKER MARGIN CORRECT CLIP (MARKER) IMPLANT
NDL FILTER BLUNT 18X1 1/2 (NEEDLE) ×1 IMPLANT
NDL HYPO 25X1 1.5 SAFETY (NEEDLE) ×1 IMPLANT
NEEDLE FILTER BLUNT 18X 1/2SAF (NEEDLE) ×1
NEEDLE FILTER BLUNT 18X1 1/2 (NEEDLE) ×1 IMPLANT
NEEDLE HYPO 22GX1.5 SAFETY (NEEDLE) ×2 IMPLANT
NEEDLE HYPO 25X1 1.5 SAFETY (NEEDLE) ×2 IMPLANT
PACK BASIN MINOR ARMC (MISCELLANEOUS) ×2 IMPLANT
SET LOCALIZER 20 PROBE US (MISCELLANEOUS) ×2 IMPLANT
SUT ETHILON 3-0 FS-10 30 BLK (SUTURE)
SUT MNCRL 4-0 (SUTURE) ×2
SUT MNCRL 4-0 27XMFL (SUTURE) ×1
SUT SILK 3 0 SH 30 (SUTURE) ×2 IMPLANT
SUT VIC AB 3-0 SH 27 (SUTURE) ×4
SUT VIC AB 3-0 SH 27X BRD (SUTURE) ×1 IMPLANT
SUTURE EHLN 3-0 FS-10 30 BLK (SUTURE) IMPLANT
SUTURE MNCRL 4-0 27XMF (SUTURE) ×1 IMPLANT
SYR 10ML LL (SYRINGE) ×4 IMPLANT
SYR BULB IRRIG 60ML STRL (SYRINGE) ×2 IMPLANT
TAPE TRANSPORE STRL 2 31045 (GAUZE/BANDAGES/DRESSINGS) IMPLANT
TRAP NEPTUNE SPECIMEN COLLECT (MISCELLANEOUS) ×2 IMPLANT
WATER STERILE IRR 1000ML POUR (IV SOLUTION) ×2 IMPLANT
WATER STERILE IRR 500ML POUR (IV SOLUTION) ×2 IMPLANT

## 2021-08-29 NOTE — Anesthesia Procedure Notes (Signed)
Anesthesia Regional Block: Other (Erector Spinae Plane Block)   Pre-Anesthetic Checklist: , timeout performed,  Correct Patient, Correct Site, Correct Laterality,  Correct Procedure, Correct Position, site marked,  Risks and benefits discussed,  Surgical consent,  Pre-op evaluation,  At surgeon's request and post-op pain management  Laterality: Left  Prep: chloraprep       Needles:  Injection technique: Single-shot  Needle Type: Stimiplex     Needle Length: 9cm  Needle Gauge: 21     Additional Needles:   Procedures:,,,, ultrasound used (permanent image in chart),,    Narrative:   Performed by: Personally   Additional Notes: Patient's chart reviewed and they were deemed appropriate candidate for procedure, per surgeon's request. Patient educated about risks, benefits, and alternatives of the block including but not limited to: temporary or permanent nerve damage, bleeding, infection, damage to surround tissues, block failure, local anesthetic toxicity, pneumothorax. Patient expressed understanding. A formal time-out was conducted consistent with institution rules.  Monitors were applied, and minimal sedation used (see nursing record). The site was prepped with skin prep and allowed to dry, and sterile gloves were used. A high frequency linear ultrasound probe with probe cover was utilized throughout. T3 transverse process visualized and appeared anatomically normal, and echogenic block needle trajectory was monitored throughout. Hydrodissection of erector spinae plane was visualized. Aspiration performed every 87m. Blood vessels were avoided. All injections were performed without resistance and free of blood and paresthesias. The patient tolerated the procedure well.  Injectate: 145mexparel + 2037m.5% bupivacaine.

## 2021-08-29 NOTE — Anesthesia Procedure Notes (Addendum)
Procedure Name: LMA Insertion Date/Time: 08/29/2021 2:43 PM  Performed by: Rande Brunt, CRNAPre-anesthesia Checklist: Patient identified, Patient being monitored, Timeout performed, Emergency Drugs available and Suction available Patient Re-evaluated:Patient Re-evaluated prior to induction Oxygen Delivery Method: Circle system utilized Preoxygenation: Pre-oxygenation with 100% oxygen Induction Type: IV induction Ventilation: Mask ventilation without difficulty LMA: LMA inserted LMA Size: 4.0 Tube type: Oral Number of attempts: 1 Placement Confirmation: positive ETCO2 and breath sounds checked- equal and bilateral Tube secured with: Tape Dental Injury: Teeth and Oropharynx as per pre-operative assessment and Dental damage

## 2021-08-29 NOTE — Anesthesia Preprocedure Evaluation (Addendum)
Anesthesia Evaluation  Patient identified by MRN, date of birth, ID band Patient awake    Reviewed: Allergy & Precautions, NPO status , Patient's Chart, lab work & pertinent test results  History of Anesthesia Complications Negative for: history of anesthetic complications  Airway Mallampati: II  TM Distance: >3 FB Neck ROM: Full    Dental  (+) Loose, Poor Dentition,    Pulmonary neg sleep apnea, COPD,  COPD inhaler and oxygen dependent, Patient abstained from smoking.Not current smoker, former smoker,  Uses oxygen PRN, around 2L/min usually. Never hospitalized for COPD exacerbation. Per pulmonology note, patient has severe - very severe COPD, at high risk of complications but as optimized as she can be.   Pulmonary exam normal breath sounds clear to auscultation       Cardiovascular Exercise Tolerance: Good METS(-) hypertension(-) CAD and (-) Past MI negative cardio ROS  (-) dysrhythmias  Rhythm:Regular Rate:Normal - Systolic murmurs    Neuro/Psych negative neurological ROS  negative psych ROS   GI/Hepatic neg GERD  ,(+)     (-) substance abuse  ,   Endo/Other  neg diabetesMorbid obesity  Renal/GU negative Renal ROS     Musculoskeletal   Abdominal (+) + obese,   Peds  Hematology   Anesthesia Other Findings Past Medical History: No date: Acute hypoxemic respiratory failure (HCC) No date: Asthma 2015: Breast cancer ([place])     Comment:  left breast cancer ADH LCIS FEA No date: COPD (chronic obstructive pulmonary disease) (HCC) No date: Hypokalemia No date: Hyponatremia No date: Obesity No date: Skin cancer     Comment:  squammous cell carcinoma  Reproductive/Obstetrics                            Anesthesia Physical Anesthesia Plan  ASA: 3  Anesthesia Plan: General   Post-op Pain Management: Regional block*, Tylenol PO (pre-op)* and Gabapentin PO (pre-op)*   Induction:  Intravenous  PONV Risk Score and Plan: 3 and TIVA, Ondansetron, Midazolam, Dexamethasone, Propofol infusion and Treatment may vary due to age or medical condition  Airway Management Planned: Natural Airway, Oral ETT and Simple Face Mask  Additional Equipment: None  Intra-op Plan:   Post-operative Plan: Possible Post-op intubation/ventilation  Informed Consent: I have reviewed the patients History and Physical, chart, labs and discussed the procedure including the risks, benefits and alternatives for the proposed anesthesia with the patient or authorized representative who has indicated his/her understanding and acceptance.     Dental advisory given  Plan Discussed with: CRNA and Surgeon  Anesthesia Plan Comments: (Plan for ESP block + natural airway sedation if possible, to avoid airway instrumentation given patient's COPD. Her COPD however seems to not be as dire as made out to be in her records, as she has never been hospitalized for COPD, and does not use her oxygen 24/7. Discussed risks of anesthesia with patient, including possibility of difficulty with spontaneous ventilation under anesthesia necessitating airway intervention, PONV, and rare risks such as cardiac or respiratory or neurological events, and allergic reactions. Discussed the role of CRNA in patient's perioperative care.Discussed potential prolonged intubation. Discussed r/b/a of Erector Spinae Plane block, including:  - bleeding, infection, nerve damage - poor or non functioning block. - reactions and toxicity to local anesthetic - Pneumothorax Patient understands. )        Anesthesia Quick Evaluation

## 2021-08-29 NOTE — Op Note (Signed)
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  Procedure:  Left breast RF tag-localized lumpectomy and RF tag-localized sentinel lymph node biopsy  Surgeon:  Melvyn Neth, MD  Anesthesia:  General endotracheal  Estimated Blood Loss:  15 ml  Specimens:   Left axilla RF tagged lymph node -- count 30 Left sentinel lymph node #1 -- hot and blue -- count 90,000 Left sentinel lymph node #2 -- hot -- count 7270 Left sentinel lymph node #3 -- count 64 Left breast mass  Complications:  None  Operation performed with curative intent:Yes  Tracer(s) used to identify sentinel nodes in the upfront surgery (non-neoadjuvant) setting (select all that apply):Dye and Radioactive Tracer  Tracer(s) used to identify sentinel nodes in the neoadjuvant setting (select all that apply):N/A  All nodes (colored or non-colored) present at the end of a dye-filled lymphatic channel were removed:Yes   All significantly radioactive nodes were removed:Yes  All palpable suspicious nodes were removed:Yes  Biopsy-proven positive nodes marked with clips prior to chemotherapy were identified and removed:Yes  Indications for Procedure:  This is a 71 y.o. female who presents with left breast cancer metastasized to one axillary lymph node.  After discussion with oncology, it was decided to proceed with upfront lumpectomy and resection of the positive lymph node with additional sentinel lymph node biopsy.  The risks of bleeding, infection, injury to surrounding structures, hematoma, seroma, open wound, cosmetic deformity, and the need for further surgery were all discussed with the patient and was willing to proceed.  Prior to this procedure, the patient had undergone RF tag localization and sentinel lymphoscintigraphy.  Description of Procedure: The patient was  correctly identified in the preoperative area and brought into the operating room.  The patient was placed supine with VTE prophylaxis in place.  Appropriate time-outs were performed.  Anesthesia was induced and the patient was intubated.  Appropriate antibiotics were infused.  A visual dye was injected in the left periareolar region under aseptic conditions. The left chest and axilla were prepped and draped in usual sterile fashion.  Then using the hand-held probe an area of high counts was identified in the axilla, and a 5 cm incision was made.  Cautery was used to dissect down the subcutaneous tissue and the hand-held probe was used to guide dissection. The Hologic probe was then used to localize the biopsied lymph node and this was resected.  It had a count of 30 and did not have any blue dye.  Then, a hot and blue lymph node was identified and resected.  This had a count of 90,000.  Additional lymph nodes were identified and resected with counts of 7270 and 64.  The cavity was irrigated and hemostasis was assured with electrocautery.  Local anesthetic was infiltrated into the skin and subcutaneous tissue of the cavity.  The wound was then closed in three layers with 3-0 Vicryl and 4-0 Monocryl and sealed with DermaBond.  Attention was turned to the RF tag localization site in the left breast which was determined using the Hologic probe.  An incision was made overlying the tag and clip.  Hologic probe was used to guide our dissection using electrocautery, and a partial mastectomy was performed with adequate margins.  MarginMarker was used to ink each of the sides of the specimen.  The specimen was then imaged to confirm that the area of concern, biopsy clip,  and RF tag were included in the excision.  This was then sent to pathology.  The cavity was irrigated and hemostasis was assured with electrocautery.  Local anesthetic was infiltrated into the skin and subcutaneous tissue of the cavity.  The wound was  then closed in three layers with 3-0 Vicryl and 4-0 Monocryl and sealed with DermaBond.  The patient was emerged from anesthesia and extubated and brought to the recovery room for further management.  The patient tolerated the procedure well and all counts were correct at the end of the case.   Melvyn Neth, MD

## 2021-08-29 NOTE — Transfer of Care (Signed)
Immediate Anesthesia Transfer of Care Note  Patient: Marie Fernandez  Procedure(s) Performed: BREAST LUMPECTOMY WITH RADIOFREQUENCY TAG IDENTIFICATION (Left: Breast) AXILLARY SENTINEL NODE BIOPSY (Left: Breast)  Patient Location: PACU  Anesthesia Type:General  Level of Consciousness: awake, alert  and oriented  Airway & Oxygen Therapy: Patient Spontanous Breathing  Post-op Assessment: Report given to RN and Post -op Vital signs reviewed and stable  Post vital signs: Reviewed and stable  Last Vitals:  Vitals Value Taken Time  BP 119/82 08/29/21 1744  Temp    Pulse 87 08/29/21 1747  Resp 14 08/29/21 1747  SpO2 98 % 08/29/21 1747  Vitals shown include unvalidated device data.  Last Pain:  Vitals:   08/29/21 1344  TempSrc: Temporal  PainSc: 0-No pain         Complications:  Encounter Notable Events  Notable Event Outcome Phase Comment  Dental trauma  Intraprocedure pre-existing loose mandibular incisor removed during extubation.

## 2021-08-29 NOTE — Discharge Instructions (Signed)

## 2021-08-29 NOTE — Interval H&P Note (Signed)
History and Physical Interval Note:  08/29/2021 2:05 PM  Marie Fernandez  has presented today for surgery, with the diagnosis of left breast cancer.  The various methods of treatment have been discussed with the patient and family. After consideration of risks, benefits and other options for treatment, the patient has consented to  Procedure(s): BREAST LUMPECTOMY WITH RADIOFREQUENCY TAG IDENTIFICATION (Left) AXILLARY SENTINEL NODE BIOPSY (Left) as a surgical intervention.  The patient's history has been reviewed, patient examined, no change in status, stable for surgery.  I have reviewed the patient's chart and labs.  Questions were answered to the patient's satisfaction.     Mosi Hannold

## 2021-08-30 ENCOUNTER — Encounter: Payer: Self-pay | Admitting: Surgery

## 2021-08-30 NOTE — Anesthesia Postprocedure Evaluation (Signed)
Anesthesia Post Note  Patient: Marie Fernandez  Procedure(s) Performed: BREAST LUMPECTOMY WITH RADIOFREQUENCY TAG IDENTIFICATION (Left: Breast) AXILLARY SENTINEL NODE BIOPSY (Left: Breast)  Patient location during evaluation: PACU Anesthesia Type: General Level of consciousness: awake and alert Pain management: pain level controlled Vital Signs Assessment: post-procedure vital signs reviewed and stable Respiratory status: spontaneous breathing, nonlabored ventilation, respiratory function stable and patient connected to nasal cannula oxygen Cardiovascular status: blood pressure returned to baseline and stable Postop Assessment: no apparent nausea or vomiting Anesthetic complications: yes Comments:  Loose tooth knocked out at extubation as documented. Patient was informed, tooth given to her. She was not perturbed and said we "did her a favor".   Encounter Notable Events  Notable Event Outcome Phase Comment  Dental trauma  Intraprocedure pre-existing loose mandibular incisor removed during extubation.     Last Vitals:  Vitals:   08/29/21 1930 08/29/21 1935  BP: 136/74   Pulse:    Resp: 18 20  Temp: (!) 36.1 C   SpO2: 94% 94%    Last Pain:  Vitals:   08/29/21 1935  TempSrc:   PainSc: 2                  Arita Miss

## 2021-09-01 ENCOUNTER — Other Ambulatory Visit: Payer: Self-pay | Admitting: Anatomic Pathology & Clinical Pathology

## 2021-09-01 ENCOUNTER — Inpatient Hospital Stay: Payer: Medicare HMO | Admitting: Oncology

## 2021-09-01 LAB — SURGICAL PATHOLOGY

## 2021-09-01 NOTE — Progress Notes (Signed)
09/01/21  Called today to discuss pathology results with patient.  The left breast mass was removed with clear margins.  A total of 4 lymph nodes were removed and only one was positive for cancer, which is the one that had been previously biopsied and known already to be positive.  Discussed with Dr. Janese Banks as well and Oncotype testing will be sent to determine if she would benefit from chemotherapy or not.  Discussed the eventual plan for radiation as well.  She has follow up with me on 09/11/21.  Olean Ree, MD

## 2021-09-05 ENCOUNTER — Ambulatory Visit: Payer: Medicare HMO | Admitting: Oncology

## 2021-09-06 ENCOUNTER — Other Ambulatory Visit: Payer: Medicare HMO

## 2021-09-06 ENCOUNTER — Encounter: Payer: Medicare HMO | Admitting: Licensed Clinical Social Worker

## 2021-09-06 ENCOUNTER — Encounter: Payer: Self-pay | Admitting: *Deleted

## 2021-09-11 ENCOUNTER — Encounter: Payer: Self-pay | Admitting: Surgery

## 2021-09-11 ENCOUNTER — Other Ambulatory Visit: Payer: Self-pay

## 2021-09-11 ENCOUNTER — Ambulatory Visit (INDEPENDENT_AMBULATORY_CARE_PROVIDER_SITE_OTHER): Payer: Medicare HMO | Admitting: Surgery

## 2021-09-11 VITALS — BP 134/70 | HR 97 | Temp 98.2°F | Ht 64.0 in | Wt 240.2 lb

## 2021-09-11 DIAGNOSIS — C50412 Malignant neoplasm of upper-outer quadrant of left female breast: Secondary | ICD-10-CM

## 2021-09-11 DIAGNOSIS — Z09 Encounter for follow-up examination after completed treatment for conditions other than malignant neoplasm: Secondary | ICD-10-CM

## 2021-09-11 DIAGNOSIS — Z170 Estrogen receptor positive status [ER+]: Secondary | ICD-10-CM

## 2021-09-11 NOTE — Patient Instructions (Signed)

## 2021-09-11 NOTE — Progress Notes (Signed)
09/11/2021  HPI: Marie Fernandez is a 71 y.o. female s/p left breast lumpectomy and SLNBx on 08/29/21.  Patient presents for follow up.  Her pathology came back with one positive lymph node which was the lymph node that was previoulsy biopsied positive.  Reports some soreness at the incisions, and reports the breast binder was very tight so she stopped wearing it today.  Vital signs: BP 134/70   Pulse 97   Temp 98.2 F (36.8 C) (Oral)   Ht '5\' 4"'$  (1.626 m)   Wt 240 lb 3.2 oz (109 kg)   SpO2 97%   BMI 41.23 kg/m    Physical Exam: Constitutional: No acute distress Breast:  Patient's left breast incision is healing well, clean, dry, intact.  There is some dependent erythema/swelling at the inferior portion of the breast, which may be related to the compression of the binder.  The left axillary incision is healing well, clean, dry, intact.  No evidence of seromas.  Assessment/Plan: This is a 71 y.o. female s/p left breast lumpectomy and SLNBx.    --Patient's wounds are healing well, without evidence of complication.  She reports the breast binder was very tight, and I think that was compressing on her left breast in a way that's created some swelling in a dependent portion.  Advised to use a sports bra to help with support so this area improves. --Oncotype testing still pending. --Follow up about mid December unless she needs a port and we can see her sooner to discuss.   Melvyn Neth, [facility]

## 2021-09-12 ENCOUNTER — Encounter: Payer: Self-pay | Admitting: Oncology

## 2021-09-13 ENCOUNTER — Encounter: Payer: Self-pay | Admitting: Oncology

## 2021-09-26 ENCOUNTER — Encounter: Payer: Self-pay | Admitting: Oncology

## 2021-09-26 ENCOUNTER — Encounter: Payer: Self-pay | Admitting: *Deleted

## 2021-09-26 ENCOUNTER — Inpatient Hospital Stay: Payer: Medicare HMO | Attending: Oncology | Admitting: Oncology

## 2021-09-26 VITALS — BP 130/72 | HR 80 | Temp 97.6°F | Resp 16 | Ht 64.0 in | Wt 240.4 lb

## 2021-09-26 DIAGNOSIS — J9610 Chronic respiratory failure, unspecified whether with hypoxia or hypercapnia: Secondary | ICD-10-CM | POA: Insufficient documentation

## 2021-09-26 DIAGNOSIS — C50412 Malignant neoplasm of upper-outer quadrant of left female breast: Secondary | ICD-10-CM | POA: Insufficient documentation

## 2021-09-26 DIAGNOSIS — Z171 Estrogen receptor negative status [ER-]: Secondary | ICD-10-CM | POA: Insufficient documentation

## 2021-09-26 DIAGNOSIS — Z87891 Personal history of nicotine dependence: Secondary | ICD-10-CM | POA: Diagnosis not present

## 2021-09-26 DIAGNOSIS — Z79811 Long term (current) use of aromatase inhibitors: Secondary | ICD-10-CM | POA: Diagnosis not present

## 2021-09-26 DIAGNOSIS — Z9981 Dependence on supplemental oxygen: Secondary | ICD-10-CM | POA: Diagnosis not present

## 2021-09-26 DIAGNOSIS — Z7189 Other specified counseling: Secondary | ICD-10-CM | POA: Diagnosis not present

## 2021-09-26 DIAGNOSIS — Z803 Family history of malignant neoplasm of breast: Secondary | ICD-10-CM | POA: Insufficient documentation

## 2021-09-26 MED ORDER — LETROZOLE 2.5 MG PO TABS: 2.5000 mg | ORAL_TABLET | Freq: Every day | ORAL | 3 refills | Status: DC

## 2021-09-26 NOTE — Progress Notes (Signed)
Pt sores sometimes from breast cancer on left side. She had 4 lymph nodes. I have advised  her to not use the left arm because of the possibility of lymphedema

## 2021-09-26 NOTE — Progress Notes (Signed)
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  Single morphologically abnormal lymph node in the left axilla.  This was followed by a ultrasound and biopsy.  Left breast biopsy showed invasive mammary carcinoma grade 312 mm.  Lymph node biopsy was also positive for macro metastatic mammary carcinoma.  ER more than 90% positive PR 11 to 20% positive and HER2 negative.  Patient will be seeing Dr. Hampton Abbot later this week.   Patient had an abnormal mammogram which showed a possible distortion/small complicated cyst at the same position of the left breast back in 2019 and a repeat mammogram was recommended in 6 months.  However due to [place] pandemic and death of her son patient did not follow through.   Family history significant for breast cancer in her maternal grandmother and maternal first cousin.  No other history of colon, gastric pancreatic cancer or melanoma's.  Patient has not had any prior abnormal breast biopsies.  She has 3 adult daughters.  Menarche at the age of 24.  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  She has been on oxygen for a year now and last saw Dr. Raul Del in April 2023.  She lives with her daughter and her family.  She  is not very active but is independent of her ADLs.  Interval history-patient is recovering well from her lumpectomy.  Denies any specific complaints at this time.  She has baseline fatigue and exertional shortness of breath  ECOG PS- 1-2 Pain scale- 0 Opioid associated constipation- no  Review of systems- Review of Systems  Constitutional:  Negative for chills, fever, malaise/fatigue and weight loss.  HENT:  Negative for congestion, ear discharge and nosebleeds.   Eyes:  Negative for blurred vision.  Respiratory:  Negative for cough, hemoptysis, sputum production, shortness of breath and wheezing.   Cardiovascular:  Negative for chest pain, palpitations, orthopnea and claudication.  Gastrointestinal:  Negative for abdominal pain, blood in stool, constipation, diarrhea, heartburn, melena, nausea and vomiting.  Genitourinary:  Negative for dysuria, flank pain, frequency, hematuria and urgency.  Musculoskeletal:  Negative for back pain, joint pain and myalgias.  Skin:  Negative for rash.  Neurological:  Negative for dizziness, tingling, focal weakness, seizures, weakness and headaches.  Endo/Heme/Allergies:  Does not bruise/bleed easily.  Psychiatric/Behavioral:  Negative for depression and suicidal ideas. The patient does not have insomnia.       Allergies  Allergen Reactions   Latex Rash     Past Medical History:  Diagnosis Date   Acute hypoxemic respiratory failure (HCC)    Allergy    Asthma    Breast cancer ([place]) 2015   left breast cancer ADH LCIS FEA   COPD (chronic obstructive pulmonary disease) (HCC)    Hypokalemia    Hyponatremia    Obesity  Skin cancer    squammous cell carcinoma     Past Surgical History:  Procedure Laterality Date   AXILLARY SENTINEL NODE BIOPSY Left 08/29/2021   Procedure: AXILLARY SENTINEL NODE BIOPSY;  Surgeon: Olean Ree, MD;  Location: [facility] ORS;  Service: General;  Laterality: Left;   BREAST BIOPSY Left 2015   stereo biopsy    BREAST BIOPSY Left 07/28/2021   [place] Core Bx 2:30 6cmfn Heart clip, hydro clip in axilla, path pending   BREAST EXCISIONAL BIOPSY Left 2015   revealing ADH, FEA, LCIS   BREAST LUMPECTOMY Left 08/21/2013   ADH, FEA, LCIS   BREAST LUMPECTOMY WITH RADIOFREQUENCY TAG IDENTIFICATION Left 16/11/9602   x 2 2:30 mass and axilla   BREAST LUMPECTOMY WITH RADIOFREQUENCY TAG IDENTIFICATION Left 5/40/9811   Procedure: BREAST LUMPECTOMY WITH RADIOFREQUENCY TAG IDENTIFICATION;  Surgeon: Olean Ree, MD;  Location: [facility] ORS;  Service: General;  Laterality: Left;   COLONOSCOPY WITH PROPOFOL N/A 12/24/2017   Procedure: COLONOSCOPY WITH PROPOFOL;  Surgeon: Jonathon Bellows, MD;  Location: [facility] ENDOSCOPY;  Service: Gastroenterology;  Laterality: N/A;   MOHS SURGERY      Social History   Socioeconomic History   Marital status: Widowed    Spouse name: Not on file   Number of children: Not on file   Years of education: Not on file   Highest education level: Not on file  Occupational History   Not on file  Tobacco Use   Smoking status: Former    Packs/day: 1.00    Years: 20.00    Total pack years: 20.00    Types: Cigarettes    Quit date: 2010    Years since quitting: 13.6    Passive exposure: Past   Smokeless tobacco: Never  Vaping Use   Vaping Use: Never used  Substance and Sexual Activity   Alcohol use: Not Currently   Drug use: Never   Sexual activity: Not Currently  Other Topics Concern   Not on file  Social History Narrative   Not on file   Social Determinants of Health   Financial Resource Strain: Not on file  Food Insecurity: Not on file  Transportation Needs: Not on file  Physical Activity: Not on file  Stress: Not on file  Social Connections: Not on file  Intimate Partner Violence: Not on file    Family History  Problem Relation Age of Onset   Hypertension Mother    Cancer Father    Cancer Sister        Skin cancer   Breast cancer Maternal Grandmother    Breast cancer  Cousin      Current Outpatient Medications:    acetaminophen (TYLENOL) 500 MG tablet, Take 2 tablets (1,000 mg total) by mouth every 6 (six) hours as needed for mild pain., Disp: , Rfl:    ADVAIR DISKUS 500-50 MCG/ACT AEPB, Inhale 1 puff into the lungs 2 (two) times daily., Disp: , Rfl:    albuterol (PROVENTIL HFA;VENTOLIN HFA) 108 (90 Base) MCG/ACT inhaler, Inhale 2 puffs into the lungs every 2 (two) hours as needed for wheezing or shortness of breath (cough)., Disp: 1 Inhaler, Rfl: 0   albuterol (PROVENTIL) (2.5 MG/3ML) 0.083% nebulizer solution, Take 2.5 mg by nebulization every 6 (six) hours as needed for wheezing or shortness of breath., Disp: , Rfl:    citalopram (CELEXA) 40 MG tablet, Take 40 mg by mouth at bedtime., Disp: , Rfl:    fluticasone (FLONASE) 50 MCG/ACT nasal spray, Place 2 sprays into both nostrils  as needed., Disp: , Rfl:    letrozole (FEMARA) 2.5 MG tablet, Take 1 tablet (2.5 mg total) by mouth daily., Disp: 30 tablet, Rfl: 3   lidocaine (LIDODERM) 5 %, Place 1 patch onto the skin daily. Remove & Discard patch within 12 hours or as directed by MD (Patient taking differently: Place 1 patch onto the skin as needed. Remove & Discard patch within 12 hours or as directed by MD), Disp: 30 patch, Rfl: 0   montelukast (SINGULAIR) 10 MG tablet, Take 1 tablet by mouth at bedtime., Disp: , Rfl:    OXYGEN, Inhale 1.5-2 L into the lungs as needed., Disp: , Rfl:    SPIRIVA HANDIHALER 18 MCG inhalation capsule, 1 capsule at bedtime., Disp: , Rfl:   Physical exam:  Vitals:   09/26/21 1113  BP: 130/72  Pulse: 80  Resp: 16  Temp: 97.6 F (36.4 C)  TempSrc: Tympanic  Weight: 240 lb 6.4 oz (109 kg)  Height: _0  (1.626 m)   Physical Exam Cardiovascular:     Rate and Rhythm: Normal rate and regular rhythm.     Heart sounds: Normal heart sounds.  Pulmonary:     Effort: Pulmonary effort is normal.     Breath sounds: Normal breath sounds.  Abdominal:     General: Bowel sounds are  normal.     Palpations: Abdomen is soft.  Skin:    General: Skin is warm and dry.  Neurological:     Mental Status: She is alert and oriented to person, place, and time.         Latest Ref Rng & Units 08/18/2021   11:57 AM  CMP  Glucose 70 - 99 mg/dL 114   BUN 8 - 23 mg/dL 10   Creatinine 0.44 - 1.00 mg/dL 0.79   Sodium 135 - 145 mmol/L 139   Potassium 3.5 - 5.1 mmol/L 3.6   Chloride 98 - 111 mmol/L 109   CO2 22 - 32 mmol/L 25   Calcium 8.9 - 10.3 mg/dL 8.9       Latest Ref Rng & Units 08/18/2021   11:57 AM  CBC  WBC 4.0 - 10.5 K/uL 7.5   Hemoglobin 12.0 - 15.0 g/dL 13.8   Hematocrit 36.0 - 46.0 % 42.5   Platelets 150 - 400 K/uL 203     No images are attached to the encounter.  MM Breast Surgical Specimen  Result Date: 08/30/2021 CLINICAL DATA:  Evaluate specimen EXAM: SPECIMEN RADIOGRAPH OF THE LEFT BREAST COMPARISON:  Previous exam(s). FINDINGS: Status post excision of the left breast. The radiofrequency device and heart shaped clip are present within the specimen. IMPRESSION: Specimen radiograph of the left breast. Electronically Signed   By: Dorise Bullion III M.D.   On: 08/30/2021 07:59  [place] OR NERVE BLOCK-IMAGE ONLY [facility])  Result Date: 08/29/2021 There is no interpretation for this exam.  This order is for images obtained during a surgical procedure.  Please See "Surgeries" Tab for more information regarding the procedure.   NM Sentinel Node Inj-No Rpt (Breast)  Result Date: 08/29/2021 Sulfur Colloid was injected by the Nuclear Medicine Technologist for sentinel lymph node localization.     Assessment and plan- Patient is a 71 y.o. female with newly diagnosed pathological prognostic stage Ia invasive mammary carcinoma of the left breast pT1 cN1 aM0 ER/PR positive HER2 negative here to discuss pathology results and further management  Discussed the results of final pathology which showed 18 mm grade 3 tumor with negative margins.  1 out of 4 sentinel lymph nodes  withPositive with extranodal extension.  Oncotype testing was sent which came back at an intermediate score of 18.  Given that she is more than 71 years of age she would not benefit from adjuvant chemotherapy with the score.  I would recommend referral to radiation oncology at this time.  Given that her tumor was ER PR positive hormone therapy is indicated at this time.  Idiscussed the role for hormone therapy. Given that she is postmenopausal I would favor 5 years of adjuvant hormone therapy with aromatase inhibitor. I discussed the risks and benefits of Arimidex including all but not limited to fatigue, hypercholesterolemia, hot flashes, arthralgias and worsening bone health.  Patient will also need to be on calcium 1200 mg along with vitamin D 800 international units.  We will obtain a baseline bone density scan written information about letrozole given to the patient. I would like her to finish radiation therapy and start hormone therapy thereafter. Patient verbalized understanding and agrees to proceed  I will tentatively see her in about 3 months after she finishes radiation treatment and see how she is tolerating letrozole.  Given that she had a grade 3 tumor that was 1 node positive she would also qualify for adjuvant abemaciclib.  I will discuss with her in more detail when I see her at next visit.  Treatment will be given with a curative intent   Cancer Staging  Malignant neoplasm of upper-outer quadrant of left breast in female, estrogen receptor negative ([place]) Staging form: Breast, AJCC 8th Edition - Clinical stage from 08/07/2021: Stage IIA (cT1c, cN1(f), cM0, G3, ER+, PR+, HER2-) - Signed by Sindy Guadeloupe, MD on 08/07/2021 Method of lymph node assessment: Core biopsy Histologic grading system: 3 grade system - Pathologic stage from 09/26/2021: Stage IA (pT1c, pN0, cM0, G3, ER+, PR+, HER2-, Oncotype DX score: 18) - Signed by Sindy Guadeloupe, MD on 09/26/2021 Multigene prognostic tests performed:  Oncotype DX Recurrence score range: Greater than or equal to 11 Histologic grading system: 3 grade system       Visit Diagnosis 1. Malignant neoplasm of upper-outer quadrant of left breast in female, estrogen receptor negative ([place])   2. Goals of care, counseling/discussion      Dr. Randa Evens, MD, MPH [facility] [phone] 09/26/2021 4:35 PM

## 2021-09-27 ENCOUNTER — Ambulatory Visit: Payer: Medicare HMO | Admitting: Radiation Oncology

## 2021-10-02 ENCOUNTER — Ambulatory Visit: Payer: Medicare HMO | Admitting: Radiation Oncology

## 2021-10-03 ENCOUNTER — Ambulatory Visit
Admission: RE | Admit: 2021-10-03 | Discharge: 2021-10-03 | Disposition: A | Discharge status: Home / Self Care | Payer: Medicare HMO | Source: Ambulatory Visit | Attending: Radiation Oncology | Admitting: Radiation Oncology

## 2021-10-03 VITALS — BP 152/78 | HR 82 | Temp 97.0°F | Resp 22 | Ht 64.0 in | Wt 243.0 lb

## 2021-10-03 DIAGNOSIS — Z170 Estrogen receptor positive status [ER+]: Secondary | ICD-10-CM

## 2021-10-03 NOTE — Consult Note (Signed)
NEW PATIENT EVALUATION  Name: Marie Fernandez  MRN: [id number]  Date:   10/03/2021     DOB: 1950/04/08   This 71 y.o. female patient presents to the clinic for initial evaluation of stage Ib (pT1 cpN1 aM0) ER/PR positive HER2 negative invasive mammary carcinoma left breast status post wide local excision and sentinel node biopsy.  REFERRING PHYSICIAN: Marguerita Merles, MD  CHIEF COMPLAINT:  Chief Complaint  Patient presents with   Breast Cancer    Consult    DIAGNOSIS: There were no encounter diagnoses.   PREVIOUS INVESTIGATIONS:  Mammogram and ultrasound reviewed Clinical notes reviewed Pathology reports reviewed  HPI: Patient is a 71 year old female who presents with an abnormal mammogram back in June of her left breast showing a highly suspicious lesion at the 2:30 position.  There is also suspicious left axillary lymph node.  Targeted ultrasound showed an invasive mammary carcinoma the 2:30 position of the left breast 6 cm from the nipple.  Biopsy her her lymph node in the left axilla was also positive for macro metastatic mammary carcinoma.  She underwent a wide local excision and sentinel node biopsy of her left breast for a 1.8 cm overall grade 3 invasive mammary carcinoma ER/PR positive HER2/neu not overexpressed.  Lymphatic and vascular invasion was present.  Margins were clear at 3 mm.  She had 4 lymph nodes examined 3 of them sentinel nodes one of them had macro metastatic disease with extracapsular extension.  She had Oncotype being testing performed with intermediate score of 18 which shows no benefit from systemic chemotherapy.  She will be receiving endocrine therapy.  She is otherwise doing well except for extreme fatigue with predates her surgery.  She specifically denies breast tenderness cough or bone pain.  PLANNED TREATMENT REGIMEN: Left breast and peripheral lymphatic radiation  PAST MEDICAL HISTORY:  has a past medical history of Acute hypoxemic respiratory failure  ([place]), Allergy, Asthma, Breast cancer ([place]) (2015), COPD (chronic obstructive pulmonary disease) ([place]), Hypokalemia, Hyponatremia, Obesity, and Skin cancer.    PAST SURGICAL HISTORY:  Past Surgical History:  Procedure Laterality Date   AXILLARY SENTINEL NODE BIOPSY Left 08/29/2021   Procedure: AXILLARY SENTINEL NODE BIOPSY;  Surgeon: Olean Ree, MD;  Location: [facility] ORS;  Service: General;  Laterality: Left;   BREAST BIOPSY Left 2015   stereo biopsy   BREAST BIOPSY Left 07/28/2021   [place] Core Bx 2:30 6cmfn Heart clip, hydro clip in axilla, path pending   BREAST EXCISIONAL BIOPSY Left 2015   revealing ADH, FEA, LCIS   BREAST LUMPECTOMY Left 08/21/2013   ADH, FEA, LCIS   BREAST LUMPECTOMY WITH RADIOFREQUENCY TAG IDENTIFICATION Left 73/71/0626   x 2 2:30 mass and axilla   BREAST LUMPECTOMY WITH RADIOFREQUENCY TAG IDENTIFICATION Left 9/48/5462   Procedure: BREAST LUMPECTOMY WITH RADIOFREQUENCY TAG IDENTIFICATION;  Surgeon: Olean Ree, MD;  Location: [facility] ORS;  Service: General;  Laterality: Left;   COLONOSCOPY WITH PROPOFOL N/A 12/24/2017   Procedure: COLONOSCOPY WITH PROPOFOL;  Surgeon: Jonathon Bellows, MD;  Location: [facility] ENDOSCOPY;  Service: Gastroenterology;  Laterality: N/A;   MOHS SURGERY      FAMILY HISTORY: family history includes Breast cancer in her cousin and maternal grandmother; Cancer in her father and sister; Hypertension in her mother.  SOCIAL HISTORY:  reports that she quit smoking about 13 years ago. Her smoking use included cigarettes. She has a 20.00 pack-year smoking history. She has been exposed to tobacco smoke. She has never used smokeless tobacco. She reports that she does  not currently use alcohol. She reports that she does not use drugs.  ALLERGIES: Latex  MEDICATIONS:  Current Outpatient Medications  Medication Sig Dispense Refill   acetaminophen (TYLENOL) 500 MG tablet Take 2 tablets (1,000 mg total) by mouth every 6 (six) hours as needed for mild pain.      ADVAIR DISKUS 500-50 MCG/ACT AEPB Inhale 1 puff into the lungs 2 (two) times daily.     albuterol (PROVENTIL HFA;VENTOLIN HFA) 108 (90 Base) MCG/ACT inhaler Inhale 2 puffs into the lungs every 2 (two) hours as needed for wheezing or shortness of breath (cough). 1 Inhaler 0   albuterol (PROVENTIL) (2.5 MG/3ML) 0.083% nebulizer solution Take 2.5 mg by nebulization every 6 (six) hours as needed for wheezing or shortness of breath.     citalopram (CELEXA) 40 MG tablet Take 40 mg by mouth at bedtime.     fluticasone (FLONASE) 50 MCG/ACT nasal spray Place 2 sprays into both nostrils as needed.     letrozole (FEMARA) 2.5 MG tablet Take 1 tablet (2.5 mg total) by mouth daily. 30 tablet 3   lidocaine (LIDODERM) 5 % Place 1 patch onto the skin daily. Remove & Discard patch within 12 hours or as directed by MD (Patient taking differently: Place 1 patch onto the skin as needed. Remove & Discard patch within 12 hours or as directed by MD) 30 patch 0   montelukast (SINGULAIR) 10 MG tablet Take 1 tablet by mouth at bedtime.     OXYGEN Inhale 1.5-2 L into the lungs as needed.     SPIRIVA HANDIHALER 18 MCG inhalation capsule 1 capsule at bedtime.     No current facility-administered medications for this encounter.    ECOG PERFORMANCE STATUS:  0 - Asymptomatic  REVIEW OF SYSTEMS: Patient is oxygen dependent. Patient denies any weight loss, fatigue, weakness, fever, chills or night sweats. Patient denies any loss of vision, blurred vision. Patient denies any ringing  of the ears or hearing loss. No irregular heartbeat. Patient denies heart murmur or history of fainting. Patient denies any chest pain or pain radiating to her upper extremities. Patient denies any shortness of breath, difficulty breathing at night, cough or hemoptysis. Patient denies any swelling in the lower legs. Patient denies any nausea vomiting, vomiting of blood, or coffee ground material in the vomitus. Patient denies any stomach pain. Patient  states has had normal bowel movements no significant constipation or diarrhea. Patient denies any dysuria, hematuria or significant nocturia. Patient denies any problems walking, swelling in the joints or loss of balance. Patient denies any skin changes, loss of hair or loss of weight. Patient denies any excessive worrying or anxiety or significant depression. Patient denies any problems with insomnia. Patient denies excessive thirst, polyuria, polydipsia. Patient denies any swollen glands, patient denies easy bruising or easy bleeding. Patient denies any recent infections, allergies or URI. Patient "s visual fields have not changed significantly in recent time. PHYSICAL EXAM: BP (!) 152/78   Pulse 82   Temp (!) 97 F (36.1 C)   Resp (!) 22   Ht '5\' 4"'  (1.626 m)   Wt 243 lb (110.2 kg)   BMI 41.71 kg/m  She status post wide local excision left breast which is well-healed also the axillary incision is well-healed no dominant mass is noted in either breast.  No axillary or supraclavicular adenopathy is identified.  Well-developed well-nourished patient in NAD. HEENT reveals PERLA, EOMI, discs not visualized.  Oral cavity is clear. No oral mucosal lesions are identified.  Neck is clear without evidence of cervical or supraclavicular adenopathy. Lungs are clear to A&P. Cardiac examination is essentially unremarkable with regular rate and rhythm without murmur rub or thrill. Abdomen is benign with no organomegaly or masses noted. Motor sensory and DTR levels are equal and symmetric in the upper and lower extremities. Cranial nerves II through XII are grossly intact. Proprioception is intact. No peripheral adenopathy or edema is identified. No motor or sensory levels are noted. Crude visual fields are within normal range.  LABORATORY DATA: Pathology report reviewed compatible with above-stated findings    RADIOLOGY RESULTS:Mammograms ultrasound reviewed compatible with above-stated findings   IMPRESSION:  Stage Ib invasive mammary carcinoma of the left breast ER/PR positive status post wide local excision and sentinel biopsy in 71 year old female PLAN: Based on the extracapsular extension of her axillary lymph node and only 4 lymph nodes being examined would include peripheral lymphatic radiation and overall treatment plan.  Would plan on delivering for 5040 cGy in 28 fractions to her left breast and axilla.  She has a large pendulous breasts and her risk of developing more significant skin reaction is explained to the patient.  Risks and benefits of treatment again including skin reaction fatigue alteration of blood counts possible inclusion of superficial lung and extreme minimal chance of lymphedema of her left upper extremity all were discussed in detail with the patient.  She seems to comprehend my treatment plan well.  I have personally set up and ordered CT simulation for next week after Labor Day.  She also will benefit from endocrine therapy after completion of radiation.  I would like to take this opportunity to thank you for allowing me to participate in the care of your patient.Noreene Filbert, MD

## 2021-10-11 ENCOUNTER — Ambulatory Visit: Payer: Medicare HMO

## 2021-10-12 ENCOUNTER — Ambulatory Visit
Admission: RE | Admit: 2021-10-12 | Discharge: 2021-10-12 | Disposition: A | Discharge status: Home / Self Care | Payer: Medicare HMO | Source: Ambulatory Visit | Attending: Radiation Oncology | Admitting: Radiation Oncology

## 2021-10-12 DIAGNOSIS — Z170 Estrogen receptor positive status [ER+]: Secondary | ICD-10-CM | POA: Diagnosis not present

## 2021-10-12 DIAGNOSIS — C773 Secondary and unspecified malignant neoplasm of axilla and upper limb lymph nodes: Secondary | ICD-10-CM | POA: Diagnosis not present

## 2021-10-12 DIAGNOSIS — C50412 Malignant neoplasm of upper-outer quadrant of left female breast: Secondary | ICD-10-CM | POA: Insufficient documentation

## 2021-10-12 DIAGNOSIS — Z510 Encounter for antineoplastic radiation therapy: Secondary | ICD-10-CM | POA: Insufficient documentation

## 2021-10-13 ENCOUNTER — Other Ambulatory Visit: Payer: Self-pay | Admitting: *Deleted

## 2021-10-13 DIAGNOSIS — Z510 Encounter for antineoplastic radiation therapy: Secondary | ICD-10-CM | POA: Diagnosis not present

## 2021-10-13 DIAGNOSIS — Z170 Estrogen receptor positive status [ER+]: Secondary | ICD-10-CM

## 2021-10-18 ENCOUNTER — Ambulatory Visit: Admission: RE | Admit: 2021-10-18 | Payer: Medicare HMO | Source: Ambulatory Visit

## 2021-10-18 DIAGNOSIS — Z510 Encounter for antineoplastic radiation therapy: Secondary | ICD-10-CM | POA: Diagnosis not present

## 2021-10-19 ENCOUNTER — Ambulatory Visit
Admission: RE | Admit: 2021-10-19 | Discharge: 2021-10-19 | Disposition: A | Discharge status: Home / Self Care | Payer: Medicare HMO | Source: Ambulatory Visit | Attending: Radiation Oncology | Admitting: Radiation Oncology

## 2021-10-19 ENCOUNTER — Other Ambulatory Visit: Payer: Self-pay

## 2021-10-19 DIAGNOSIS — Z510 Encounter for antineoplastic radiation therapy: Secondary | ICD-10-CM | POA: Diagnosis not present

## 2021-10-19 LAB — RAD ONC ARIA SESSION SUMMARY
Course Elapsed Days: 0
Plan Fractions Treated to Date: 1
Plan Prescribed Dose Per Fraction: 1.8 Gy
Plan Total Fractions Prescribed: 28
Plan Total Prescribed Dose: 50.4 Gy
Reference Point Dosage Given to Date: 1.8 Gy
Reference Point Session Dosage Given: 1.8 Gy
Session Number: 1

## 2021-10-20 ENCOUNTER — Other Ambulatory Visit: Payer: Self-pay

## 2021-10-20 ENCOUNTER — Ambulatory Visit
Admission: RE | Admit: 2021-10-20 | Discharge: 2021-10-20 | Disposition: A | Discharge status: Home / Self Care | Payer: Medicare HMO | Source: Ambulatory Visit | Attending: Radiation Oncology | Admitting: Radiation Oncology

## 2021-10-20 DIAGNOSIS — Z510 Encounter for antineoplastic radiation therapy: Secondary | ICD-10-CM | POA: Diagnosis not present

## 2021-10-20 LAB — RAD ONC ARIA SESSION SUMMARY
Course Elapsed Days: 1
Plan Fractions Treated to Date: 2
Plan Prescribed Dose Per Fraction: 1.8 Gy
Plan Total Fractions Prescribed: 28
Plan Total Prescribed Dose: 50.4 Gy
Reference Point Dosage Given to Date: 3.6 Gy
Reference Point Session Dosage Given: 1.8 Gy
Session Number: 2

## 2021-10-23 ENCOUNTER — Ambulatory Visit
Admission: RE | Admit: 2021-10-23 | Discharge: 2021-10-23 | Disposition: A | Discharge status: Home / Self Care | Payer: Medicare HMO | Source: Ambulatory Visit | Attending: Radiation Oncology | Admitting: Radiation Oncology

## 2021-10-23 ENCOUNTER — Other Ambulatory Visit: Payer: Self-pay

## 2021-10-23 DIAGNOSIS — Z510 Encounter for antineoplastic radiation therapy: Secondary | ICD-10-CM | POA: Diagnosis not present

## 2021-10-23 LAB — RAD ONC ARIA SESSION SUMMARY
Course Elapsed Days: 4
Plan Fractions Treated to Date: 3
Plan Prescribed Dose Per Fraction: 1.8 Gy
Plan Total Fractions Prescribed: 28
Plan Total Prescribed Dose: 50.4 Gy
Reference Point Dosage Given to Date: 5.4 Gy
Reference Point Session Dosage Given: 1.8 Gy
Session Number: 3

## 2021-10-24 ENCOUNTER — Other Ambulatory Visit: Payer: Self-pay

## 2021-10-24 ENCOUNTER — Ambulatory Visit
Admission: RE | Admit: 2021-10-24 | Discharge: 2021-10-24 | Disposition: A | Discharge status: Home / Self Care | Payer: Medicare HMO | Source: Ambulatory Visit | Attending: Radiation Oncology | Admitting: Radiation Oncology

## 2021-10-24 DIAGNOSIS — Z510 Encounter for antineoplastic radiation therapy: Secondary | ICD-10-CM | POA: Diagnosis not present

## 2021-10-24 LAB — RAD ONC ARIA SESSION SUMMARY
Course Elapsed Days: 5
Plan Fractions Treated to Date: 4
Plan Prescribed Dose Per Fraction: 1.8 Gy
Plan Total Fractions Prescribed: 28
Plan Total Prescribed Dose: 50.4 Gy
Reference Point Dosage Given to Date: 7.2 Gy
Reference Point Session Dosage Given: 1.8 Gy
Session Number: 4

## 2021-10-25 ENCOUNTER — Ambulatory Visit: Payer: Medicare HMO

## 2021-10-26 ENCOUNTER — Other Ambulatory Visit: Payer: Self-pay

## 2021-10-26 ENCOUNTER — Ambulatory Visit
Admission: RE | Admit: 2021-10-26 | Discharge: 2021-10-26 | Disposition: A | Discharge status: Home / Self Care | Payer: Medicare HMO | Source: Ambulatory Visit | Attending: Radiation Oncology | Admitting: Radiation Oncology

## 2021-10-26 DIAGNOSIS — Z510 Encounter for antineoplastic radiation therapy: Secondary | ICD-10-CM | POA: Diagnosis not present

## 2021-10-26 LAB — RAD ONC ARIA SESSION SUMMARY
Course Elapsed Days: 7
Plan Fractions Treated to Date: 5
Plan Prescribed Dose Per Fraction: 1.8 Gy
Plan Total Fractions Prescribed: 28
Plan Total Prescribed Dose: 50.4 Gy
Reference Point Dosage Given to Date: 9 Gy
Reference Point Session Dosage Given: 1.8 Gy
Session Number: 5

## 2021-10-27 ENCOUNTER — Ambulatory Visit
Admission: RE | Admit: 2021-10-27 | Discharge: 2021-10-27 | Disposition: A | Discharge status: Home / Self Care | Payer: Medicare HMO | Source: Ambulatory Visit | Attending: Radiation Oncology | Admitting: Radiation Oncology

## 2021-10-27 ENCOUNTER — Other Ambulatory Visit: Payer: Self-pay

## 2021-10-27 DIAGNOSIS — Z510 Encounter for antineoplastic radiation therapy: Secondary | ICD-10-CM | POA: Diagnosis not present

## 2021-10-27 LAB — RAD ONC ARIA SESSION SUMMARY
Course Elapsed Days: 8
Plan Fractions Treated to Date: 6
Plan Prescribed Dose Per Fraction: 1.8 Gy
Plan Total Fractions Prescribed: 28
Plan Total Prescribed Dose: 50.4 Gy
Reference Point Dosage Given to Date: 10.8 Gy
Reference Point Session Dosage Given: 1.8 Gy
Session Number: 6

## 2021-10-30 ENCOUNTER — Ambulatory Visit
Admission: RE | Admit: 2021-10-30 | Discharge: 2021-10-30 | Disposition: A | Discharge status: Home / Self Care | Payer: Medicare HMO | Source: Ambulatory Visit | Attending: Radiation Oncology | Admitting: Radiation Oncology

## 2021-10-30 ENCOUNTER — Other Ambulatory Visit: Payer: Self-pay

## 2021-10-30 DIAGNOSIS — Z510 Encounter for antineoplastic radiation therapy: Secondary | ICD-10-CM | POA: Diagnosis not present

## 2021-10-30 LAB — RAD ONC ARIA SESSION SUMMARY
Course Elapsed Days: 11
Plan Fractions Treated to Date: 7
Plan Prescribed Dose Per Fraction: 1.8 Gy
Plan Total Fractions Prescribed: 28
Plan Total Prescribed Dose: 50.4 Gy
Reference Point Dosage Given to Date: 12.6 Gy
Reference Point Session Dosage Given: 1.8 Gy
Session Number: 7

## 2021-10-30 MED ORDER — LETROZOLE 2.5 MG PO TABS: 2.5000 mg | ORAL_TABLET | Freq: Every day | ORAL | 3 refills | Status: DC

## 2021-10-31 ENCOUNTER — Ambulatory Visit
Admission: RE | Admit: 2021-10-31 | Discharge: 2021-10-31 | Disposition: A | Discharge status: Home / Self Care | Payer: Medicare HMO | Source: Ambulatory Visit | Attending: Radiation Oncology | Admitting: Radiation Oncology

## 2021-10-31 ENCOUNTER — Other Ambulatory Visit: Payer: Self-pay

## 2021-10-31 DIAGNOSIS — Z510 Encounter for antineoplastic radiation therapy: Secondary | ICD-10-CM | POA: Diagnosis not present

## 2021-10-31 LAB — RAD ONC ARIA SESSION SUMMARY
Course Elapsed Days: 12
Plan Fractions Treated to Date: 8
Plan Prescribed Dose Per Fraction: 1.8 Gy
Plan Total Fractions Prescribed: 28
Plan Total Prescribed Dose: 50.4 Gy
Reference Point Dosage Given to Date: 14.4 Gy
Reference Point Session Dosage Given: 1.8 Gy
Session Number: 8

## 2021-11-01 ENCOUNTER — Ambulatory Visit
Admission: RE | Admit: 2021-11-01 | Discharge: 2021-11-01 | Disposition: A | Discharge status: Home / Self Care | Payer: Medicare HMO | Source: Ambulatory Visit | Attending: Radiation Oncology | Admitting: Radiation Oncology

## 2021-11-01 ENCOUNTER — Inpatient Hospital Stay: Payer: Medicare HMO | Attending: Oncology

## 2021-11-01 ENCOUNTER — Other Ambulatory Visit: Payer: Self-pay

## 2021-11-01 DIAGNOSIS — C50412 Malignant neoplasm of upper-outer quadrant of left female breast: Secondary | ICD-10-CM | POA: Diagnosis present

## 2021-11-01 DIAGNOSIS — Z170 Estrogen receptor positive status [ER+]: Secondary | ICD-10-CM | POA: Insufficient documentation

## 2021-11-01 DIAGNOSIS — Z510 Encounter for antineoplastic radiation therapy: Secondary | ICD-10-CM | POA: Diagnosis present

## 2021-11-01 LAB — RAD ONC ARIA SESSION SUMMARY
Course Elapsed Days: 13
Plan Fractions Treated to Date: 9
Plan Prescribed Dose Per Fraction: 1.8 Gy
Plan Total Fractions Prescribed: 28
Plan Total Prescribed Dose: 50.4 Gy
Reference Point Dosage Given to Date: 16.2 Gy
Reference Point Session Dosage Given: 1.8 Gy
Session Number: 9

## 2021-11-01 LAB — CBC
HCT: 39.7 % (ref 36.0–46.0)
Hemoglobin: 13.5 g/dL (ref 12.0–15.0)
MCH: 29.6 pg (ref 26.0–34.0)
MCHC: 34 g/dL (ref 30.0–36.0)
MCV: 87.1 fL (ref 80.0–100.0)
Platelets: 207 10*3/uL (ref 150–400)
RBC: 4.56 MIL/uL (ref 3.87–5.11)
RDW: 13.6 % (ref 11.5–15.5)
WBC: 8 10*3/uL (ref 4.0–10.5)
nRBC: 0 % (ref 0.0–0.2)

## 2021-11-02 ENCOUNTER — Ambulatory Visit: Payer: Medicare HMO

## 2021-11-03 ENCOUNTER — Other Ambulatory Visit: Payer: Self-pay | Admitting: *Deleted

## 2021-11-03 ENCOUNTER — Ambulatory Visit: Payer: Medicare HMO

## 2021-11-03 MED ORDER — LETROZOLE 2.5 MG PO TABS: 2.5000 mg | ORAL_TABLET | Freq: Every day | ORAL | 3 refills | Status: AC

## 2021-11-03 NOTE — Telephone Encounter (Signed)
Letrozole needs to go to Mail order pharmacy

## 2021-11-06 ENCOUNTER — Ambulatory Visit
Admission: RE | Admit: 2021-11-06 | Discharge: 2021-11-06 | Disposition: A | Discharge status: Home / Self Care | Payer: Medicare HMO | Source: Ambulatory Visit | Attending: Radiation Oncology | Admitting: Radiation Oncology

## 2021-11-06 ENCOUNTER — Other Ambulatory Visit: Payer: Self-pay

## 2021-11-06 DIAGNOSIS — Z510 Encounter for antineoplastic radiation therapy: Secondary | ICD-10-CM | POA: Diagnosis present

## 2021-11-06 DIAGNOSIS — C50412 Malignant neoplasm of upper-outer quadrant of left female breast: Secondary | ICD-10-CM | POA: Diagnosis present

## 2021-11-06 DIAGNOSIS — C773 Secondary and unspecified malignant neoplasm of axilla and upper limb lymph nodes: Secondary | ICD-10-CM | POA: Insufficient documentation

## 2021-11-06 DIAGNOSIS — Z170 Estrogen receptor positive status [ER+]: Secondary | ICD-10-CM | POA: Insufficient documentation

## 2021-11-06 LAB — RAD ONC ARIA SESSION SUMMARY
Course Elapsed Days: 18
Plan Fractions Treated to Date: 10
Plan Prescribed Dose Per Fraction: 1.8 Gy
Plan Total Fractions Prescribed: 28
Plan Total Prescribed Dose: 50.4 Gy
Reference Point Dosage Given to Date: 18 Gy
Reference Point Session Dosage Given: 1.8 Gy
Session Number: 10

## 2021-11-07 ENCOUNTER — Ambulatory Visit
Admission: RE | Admit: 2021-11-07 | Discharge: 2021-11-07 | Disposition: A | Discharge status: Home / Self Care | Payer: Medicare HMO | Source: Ambulatory Visit | Attending: Radiation Oncology | Admitting: Radiation Oncology

## 2021-11-07 ENCOUNTER — Other Ambulatory Visit: Payer: Self-pay

## 2021-11-07 DIAGNOSIS — Z510 Encounter for antineoplastic radiation therapy: Secondary | ICD-10-CM | POA: Diagnosis not present

## 2021-11-07 LAB — RAD ONC ARIA SESSION SUMMARY
Course Elapsed Days: 19
Plan Fractions Treated to Date: 11
Plan Prescribed Dose Per Fraction: 1.8 Gy
Plan Total Fractions Prescribed: 28
Plan Total Prescribed Dose: 50.4 Gy
Reference Point Dosage Given to Date: 19.8 Gy
Reference Point Session Dosage Given: 1.8 Gy
Session Number: 11

## 2021-11-08 ENCOUNTER — Ambulatory Visit: Payer: Medicare HMO

## 2021-11-09 ENCOUNTER — Ambulatory Visit
Admission: RE | Admit: 2021-11-09 | Discharge: 2021-11-09 | Disposition: A | Discharge status: Home / Self Care | Payer: Medicare HMO | Source: Ambulatory Visit | Attending: Radiation Oncology | Admitting: Radiation Oncology

## 2021-11-09 ENCOUNTER — Other Ambulatory Visit: Payer: Self-pay

## 2021-11-09 DIAGNOSIS — Z510 Encounter for antineoplastic radiation therapy: Secondary | ICD-10-CM | POA: Diagnosis not present

## 2021-11-09 LAB — RAD ONC ARIA SESSION SUMMARY
Course Elapsed Days: 21
Plan Fractions Treated to Date: 12
Plan Prescribed Dose Per Fraction: 1.8 Gy
Plan Total Fractions Prescribed: 28
Plan Total Prescribed Dose: 50.4 Gy
Reference Point Dosage Given to Date: 21.6 Gy
Reference Point Session Dosage Given: 1.8 Gy
Session Number: 12

## 2021-11-10 ENCOUNTER — Other Ambulatory Visit: Payer: Self-pay

## 2021-11-10 ENCOUNTER — Ambulatory Visit
Admission: RE | Admit: 2021-11-10 | Discharge: 2021-11-10 | Disposition: A | Discharge status: Home / Self Care | Payer: Medicare HMO | Source: Ambulatory Visit | Attending: Radiation Oncology | Admitting: Radiation Oncology

## 2021-11-10 DIAGNOSIS — Z510 Encounter for antineoplastic radiation therapy: Secondary | ICD-10-CM | POA: Diagnosis not present

## 2021-11-10 LAB — RAD ONC ARIA SESSION SUMMARY
Course Elapsed Days: 22
Plan Fractions Treated to Date: 13
Plan Prescribed Dose Per Fraction: 1.8 Gy
Plan Total Fractions Prescribed: 28
Plan Total Prescribed Dose: 50.4 Gy
Reference Point Dosage Given to Date: 23.4 Gy
Reference Point Session Dosage Given: 1.8 Gy
Session Number: 13

## 2021-11-13 ENCOUNTER — Ambulatory Visit
Admission: RE | Admit: 2021-11-13 | Discharge: 2021-11-13 | Disposition: A | Discharge status: Home / Self Care | Payer: Medicare HMO | Source: Ambulatory Visit | Attending: Radiation Oncology | Admitting: Radiation Oncology

## 2021-11-13 ENCOUNTER — Other Ambulatory Visit: Payer: Self-pay

## 2021-11-13 DIAGNOSIS — Z510 Encounter for antineoplastic radiation therapy: Secondary | ICD-10-CM | POA: Diagnosis not present

## 2021-11-13 LAB — RAD ONC ARIA SESSION SUMMARY
Course Elapsed Days: 25
Plan Fractions Treated to Date: 14
Plan Prescribed Dose Per Fraction: 1.8 Gy
Plan Total Fractions Prescribed: 28
Plan Total Prescribed Dose: 50.4 Gy
Reference Point Dosage Given to Date: 25.2 Gy
Reference Point Session Dosage Given: 1.8 Gy
Session Number: 14

## 2021-11-14 ENCOUNTER — Other Ambulatory Visit: Payer: Self-pay

## 2021-11-14 ENCOUNTER — Ambulatory Visit
Admission: RE | Admit: 2021-11-14 | Discharge: 2021-11-14 | Disposition: A | Discharge status: Home / Self Care | Payer: Medicare HMO | Source: Ambulatory Visit | Attending: Radiation Oncology | Admitting: Radiation Oncology

## 2021-11-14 DIAGNOSIS — Z510 Encounter for antineoplastic radiation therapy: Secondary | ICD-10-CM | POA: Diagnosis not present

## 2021-11-14 LAB — RAD ONC ARIA SESSION SUMMARY
Course Elapsed Days: 26
Plan Fractions Treated to Date: 15
Plan Prescribed Dose Per Fraction: 1.8 Gy
Plan Total Fractions Prescribed: 28
Plan Total Prescribed Dose: 50.4 Gy
Reference Point Dosage Given to Date: 27 Gy
Reference Point Session Dosage Given: 1.8 Gy
Session Number: 15

## 2021-11-15 ENCOUNTER — Inpatient Hospital Stay: Payer: Medicare HMO

## 2021-11-15 ENCOUNTER — Ambulatory Visit
Admission: RE | Admit: 2021-11-15 | Discharge: 2021-11-15 | Disposition: A | Discharge status: Home / Self Care | Payer: Medicare HMO | Source: Ambulatory Visit | Attending: Radiation Oncology | Admitting: Radiation Oncology

## 2021-11-15 ENCOUNTER — Other Ambulatory Visit: Payer: Self-pay

## 2021-11-15 DIAGNOSIS — Z171 Estrogen receptor negative status [ER-]: Secondary | ICD-10-CM | POA: Insufficient documentation

## 2021-11-15 DIAGNOSIS — Z510 Encounter for antineoplastic radiation therapy: Secondary | ICD-10-CM | POA: Diagnosis not present

## 2021-11-15 DIAGNOSIS — C50412 Malignant neoplasm of upper-outer quadrant of left female breast: Secondary | ICD-10-CM | POA: Insufficient documentation

## 2021-11-15 LAB — RAD ONC ARIA SESSION SUMMARY
Course Elapsed Days: 27
Plan Fractions Treated to Date: 16
Plan Prescribed Dose Per Fraction: 1.8 Gy
Plan Total Fractions Prescribed: 28
Plan Total Prescribed Dose: 50.4 Gy
Reference Point Dosage Given to Date: 28.8 Gy
Reference Point Session Dosage Given: 1.8 Gy
Session Number: 16

## 2021-11-15 LAB — CBC
HCT: 40.9 % (ref 36.0–46.0)
Hemoglobin: 13.9 g/dL (ref 12.0–15.0)
MCH: 29.3 pg (ref 26.0–34.0)
MCHC: 34 g/dL (ref 30.0–36.0)
MCV: 86.3 fL (ref 80.0–100.0)
Platelets: 194 10*3/uL (ref 150–400)
RBC: 4.74 MIL/uL (ref 3.87–5.11)
RDW: 13.2 % (ref 11.5–15.5)
WBC: 8.4 10*3/uL (ref 4.0–10.5)
nRBC: 0 % (ref 0.0–0.2)

## 2021-11-16 ENCOUNTER — Ambulatory Visit
Admission: RE | Admit: 2021-11-16 | Discharge: 2021-11-16 | Disposition: A | Discharge status: Home / Self Care | Payer: Medicare HMO | Source: Ambulatory Visit | Attending: Radiation Oncology | Admitting: Radiation Oncology

## 2021-11-16 ENCOUNTER — Other Ambulatory Visit: Payer: Self-pay

## 2021-11-16 DIAGNOSIS — Z510 Encounter for antineoplastic radiation therapy: Secondary | ICD-10-CM | POA: Diagnosis not present

## 2021-11-16 LAB — RAD ONC ARIA SESSION SUMMARY
Course Elapsed Days: 28
Plan Fractions Treated to Date: 17
Plan Prescribed Dose Per Fraction: 1.8 Gy
Plan Total Fractions Prescribed: 28
Plan Total Prescribed Dose: 50.4 Gy
Reference Point Dosage Given to Date: 30.6 Gy
Reference Point Session Dosage Given: 1.8 Gy
Session Number: 17

## 2021-11-17 ENCOUNTER — Ambulatory Visit: Payer: Medicare HMO

## 2021-11-19 ENCOUNTER — Ambulatory Visit: Payer: Medicare HMO

## 2021-11-20 ENCOUNTER — Other Ambulatory Visit: Payer: Self-pay

## 2021-11-20 ENCOUNTER — Ambulatory Visit
Admission: RE | Admit: 2021-11-20 | Discharge: 2021-11-20 | Disposition: A | Discharge status: Home / Self Care | Payer: Medicare HMO | Source: Ambulatory Visit | Attending: Radiation Oncology | Admitting: Radiation Oncology

## 2021-11-20 DIAGNOSIS — Z510 Encounter for antineoplastic radiation therapy: Secondary | ICD-10-CM | POA: Diagnosis not present

## 2021-11-20 LAB — RAD ONC ARIA SESSION SUMMARY
Course Elapsed Days: 32
Plan Fractions Treated to Date: 18
Plan Prescribed Dose Per Fraction: 1.8 Gy
Plan Total Fractions Prescribed: 28
Plan Total Prescribed Dose: 50.4 Gy
Reference Point Dosage Given to Date: 32.4 Gy
Reference Point Session Dosage Given: 1.8 Gy
Session Number: 18

## 2021-11-21 ENCOUNTER — Other Ambulatory Visit: Payer: Self-pay

## 2021-11-21 ENCOUNTER — Ambulatory Visit
Admission: RE | Admit: 2021-11-21 | Discharge: 2021-11-21 | Disposition: A | Discharge status: Home / Self Care | Payer: Medicare HMO | Source: Ambulatory Visit | Attending: Radiation Oncology | Admitting: Radiation Oncology

## 2021-11-21 DIAGNOSIS — Z510 Encounter for antineoplastic radiation therapy: Secondary | ICD-10-CM | POA: Diagnosis not present

## 2021-11-21 LAB — RAD ONC ARIA SESSION SUMMARY
Course Elapsed Days: 33
Plan Fractions Treated to Date: 19
Plan Prescribed Dose Per Fraction: 1.8 Gy
Plan Total Fractions Prescribed: 28
Plan Total Prescribed Dose: 50.4 Gy
Reference Point Dosage Given to Date: 34.2 Gy
Reference Point Session Dosage Given: 1.8 Gy
Session Number: 19

## 2021-11-22 ENCOUNTER — Other Ambulatory Visit: Payer: Self-pay

## 2021-11-22 ENCOUNTER — Ambulatory Visit
Admission: RE | Admit: 2021-11-22 | Discharge: 2021-11-22 | Disposition: A | Discharge status: Home / Self Care | Payer: Medicare HMO | Source: Ambulatory Visit | Attending: Radiation Oncology | Admitting: Radiation Oncology

## 2021-11-22 DIAGNOSIS — Z510 Encounter for antineoplastic radiation therapy: Secondary | ICD-10-CM | POA: Diagnosis not present

## 2021-11-22 LAB — RAD ONC ARIA SESSION SUMMARY
Course Elapsed Days: 34
Plan Fractions Treated to Date: 20
Plan Prescribed Dose Per Fraction: 1.8 Gy
Plan Total Fractions Prescribed: 28
Plan Total Prescribed Dose: 50.4 Gy
Reference Point Dosage Given to Date: 36 Gy
Reference Point Session Dosage Given: 1.8 Gy
Session Number: 20

## 2021-11-23 ENCOUNTER — Ambulatory Visit: Payer: Medicare HMO

## 2021-11-24 ENCOUNTER — Ambulatory Visit: Payer: Medicare HMO

## 2021-11-27 ENCOUNTER — Ambulatory Visit
Admission: RE | Admit: 2021-11-27 | Discharge: 2021-11-27 | Disposition: A | Discharge status: Home / Self Care | Payer: Medicare HMO | Source: Ambulatory Visit | Attending: Radiation Oncology | Admitting: Radiation Oncology

## 2021-11-27 ENCOUNTER — Other Ambulatory Visit: Payer: Self-pay

## 2021-11-27 DIAGNOSIS — Z510 Encounter for antineoplastic radiation therapy: Secondary | ICD-10-CM | POA: Diagnosis not present

## 2021-11-27 LAB — RAD ONC ARIA SESSION SUMMARY
Course Elapsed Days: 39
Plan Fractions Treated to Date: 21
Plan Prescribed Dose Per Fraction: 1.8 Gy
Plan Total Fractions Prescribed: 28
Plan Total Prescribed Dose: 50.4 Gy
Reference Point Dosage Given to Date: 37.8 Gy
Reference Point Session Dosage Given: 1.8 Gy
Session Number: 21

## 2021-11-28 ENCOUNTER — Other Ambulatory Visit: Payer: Self-pay

## 2021-11-28 ENCOUNTER — Ambulatory Visit
Admission: RE | Admit: 2021-11-28 | Discharge: 2021-11-28 | Disposition: A | Discharge status: Home / Self Care | Payer: Medicare HMO | Source: Ambulatory Visit | Attending: Radiation Oncology | Admitting: Radiation Oncology

## 2021-11-28 ENCOUNTER — Ambulatory Visit: Payer: Medicare HMO

## 2021-11-28 DIAGNOSIS — Z510 Encounter for antineoplastic radiation therapy: Secondary | ICD-10-CM | POA: Diagnosis not present

## 2021-11-28 LAB — RAD ONC ARIA SESSION SUMMARY
Course Elapsed Days: 40
Plan Fractions Treated to Date: 22
Plan Prescribed Dose Per Fraction: 1.8 Gy
Plan Total Fractions Prescribed: 28
Plan Total Prescribed Dose: 50.4 Gy
Reference Point Dosage Given to Date: 39.6 Gy
Reference Point Session Dosage Given: 1.8 Gy
Session Number: 22

## 2021-11-29 ENCOUNTER — Ambulatory Visit: Payer: Medicare HMO

## 2021-11-29 ENCOUNTER — Ambulatory Visit
Admission: RE | Admit: 2021-11-29 | Discharge: 2021-11-29 | Disposition: A | Discharge status: Home / Self Care | Payer: Medicare HMO | Source: Ambulatory Visit | Attending: Radiation Oncology | Admitting: Radiation Oncology

## 2021-11-29 ENCOUNTER — Other Ambulatory Visit: Payer: Self-pay

## 2021-11-29 ENCOUNTER — Inpatient Hospital Stay: Payer: Medicare HMO

## 2021-11-29 DIAGNOSIS — Z510 Encounter for antineoplastic radiation therapy: Secondary | ICD-10-CM | POA: Diagnosis not present

## 2021-11-29 DIAGNOSIS — Z170 Estrogen receptor positive status [ER+]: Secondary | ICD-10-CM

## 2021-11-29 LAB — RAD ONC ARIA SESSION SUMMARY
Course Elapsed Days: 41
Plan Fractions Treated to Date: 23
Plan Prescribed Dose Per Fraction: 1.8 Gy
Plan Total Fractions Prescribed: 28
Plan Total Prescribed Dose: 50.4 Gy
Reference Point Dosage Given to Date: 41.4 Gy
Reference Point Session Dosage Given: 1.8 Gy
Session Number: 23

## 2021-11-29 LAB — CBC
HCT: 41 % (ref 36.0–46.0)
Hemoglobin: 13.5 g/dL (ref 12.0–15.0)
MCH: 29.2 pg (ref 26.0–34.0)
MCHC: 32.9 g/dL (ref 30.0–36.0)
MCV: 88.6 fL (ref 80.0–100.0)
Platelets: 185 10*3/uL (ref 150–400)
RBC: 4.63 MIL/uL (ref 3.87–5.11)
RDW: 13.5 % (ref 11.5–15.5)
WBC: 7.1 10*3/uL (ref 4.0–10.5)
nRBC: 0 % (ref 0.0–0.2)

## 2021-11-30 ENCOUNTER — Ambulatory Visit: Payer: Medicare HMO

## 2021-11-30 ENCOUNTER — Ambulatory Visit
Admission: RE | Admit: 2021-11-30 | Discharge: 2021-11-30 | Disposition: A | Discharge status: Home / Self Care | Payer: Medicare HMO | Source: Ambulatory Visit | Attending: Radiation Oncology | Admitting: Radiation Oncology

## 2021-11-30 ENCOUNTER — Other Ambulatory Visit: Payer: Self-pay

## 2021-11-30 DIAGNOSIS — Z510 Encounter for antineoplastic radiation therapy: Secondary | ICD-10-CM | POA: Diagnosis not present

## 2021-11-30 LAB — RAD ONC ARIA SESSION SUMMARY
Course Elapsed Days: 42
Plan Fractions Treated to Date: 24
Plan Prescribed Dose Per Fraction: 1.8 Gy
Plan Total Fractions Prescribed: 28
Plan Total Prescribed Dose: 50.4 Gy
Reference Point Dosage Given to Date: 43.2 Gy
Reference Point Session Dosage Given: 1.8 Gy
Session Number: 24

## 2021-12-01 ENCOUNTER — Ambulatory Visit: Payer: Medicare HMO

## 2021-12-01 ENCOUNTER — Ambulatory Visit
Admission: RE | Admit: 2021-12-01 | Discharge: 2021-12-01 | Disposition: A | Discharge status: Home / Self Care | Payer: Medicare HMO | Source: Ambulatory Visit | Attending: Radiation Oncology | Admitting: Radiation Oncology

## 2021-12-01 ENCOUNTER — Other Ambulatory Visit: Payer: Self-pay

## 2021-12-01 DIAGNOSIS — Z510 Encounter for antineoplastic radiation therapy: Secondary | ICD-10-CM | POA: Diagnosis not present

## 2021-12-01 LAB — RAD ONC ARIA SESSION SUMMARY
Course Elapsed Days: 43
Plan Fractions Treated to Date: 25
Plan Prescribed Dose Per Fraction: 1.8 Gy
Plan Total Fractions Prescribed: 28
Plan Total Prescribed Dose: 50.4 Gy
Reference Point Dosage Given to Date: 45 Gy
Reference Point Session Dosage Given: 1.8 Gy
Session Number: 25

## 2021-12-04 ENCOUNTER — Ambulatory Visit: Payer: Medicare HMO

## 2021-12-05 ENCOUNTER — Ambulatory Visit: Payer: Medicare HMO

## 2021-12-06 ENCOUNTER — Ambulatory Visit: Payer: Medicare HMO

## 2021-12-07 ENCOUNTER — Ambulatory Visit: Payer: Medicare HMO

## 2021-12-08 ENCOUNTER — Ambulatory Visit: Payer: Medicare HMO

## 2021-12-11 ENCOUNTER — Ambulatory Visit: Payer: Medicare HMO

## 2021-12-11 ENCOUNTER — Telehealth: Payer: Self-pay | Admitting: *Deleted

## 2021-12-11 NOTE — Telephone Encounter (Signed)
Called patient to confirm that she was no longer coming to treatments as she had left [place] a voicemail. Patient stated "she just can't the radiation treatments have wiped her out". Patient agreed to come in for follow up in one month.

## 2021-12-12 ENCOUNTER — Ambulatory Visit: Payer: Medicare HMO

## 2021-12-13 ENCOUNTER — Ambulatory Visit: Payer: Medicare HMO

## 2021-12-14 ENCOUNTER — Ambulatory Visit: Payer: Medicare HMO

## 2021-12-15 ENCOUNTER — Ambulatory Visit: Payer: Medicare HMO

## 2021-12-18 ENCOUNTER — Ambulatory Visit: Payer: Medicare HMO

## 2021-12-18 ENCOUNTER — Encounter: Payer: Self-pay | Admitting: *Deleted

## 2021-12-19 ENCOUNTER — Ambulatory Visit: Payer: Medicare HMO

## 2021-12-20 ENCOUNTER — Ambulatory Visit: Payer: Medicare HMO

## 2021-12-20 ENCOUNTER — Ambulatory Visit
Admission: RE | Admit: 2021-12-20 | Discharge: 2021-12-20 | Disposition: A | Discharge status: Home / Self Care | Payer: Medicare HMO | Source: Ambulatory Visit | Attending: Oncology | Admitting: Oncology

## 2021-12-20 DIAGNOSIS — C50412 Malignant neoplasm of upper-outer quadrant of left female breast: Secondary | ICD-10-CM | POA: Diagnosis present

## 2021-12-20 DIAGNOSIS — Z171 Estrogen receptor negative status [ER-]: Secondary | ICD-10-CM | POA: Insufficient documentation

## 2021-12-20 DIAGNOSIS — M810 Age-related osteoporosis without current pathological fracture: Secondary | ICD-10-CM | POA: Insufficient documentation

## 2021-12-20 DIAGNOSIS — Z13820 Encounter for screening for osteoporosis: Secondary | ICD-10-CM | POA: Diagnosis not present

## 2021-12-20 DIAGNOSIS — Z780 Asymptomatic menopausal state: Secondary | ICD-10-CM | POA: Insufficient documentation

## 2021-12-21 ENCOUNTER — Ambulatory Visit: Payer: Medicare HMO

## 2022-01-10 ENCOUNTER — Ambulatory Visit: Payer: Medicare HMO | Admitting: Surgery

## 2022-01-16 ENCOUNTER — Encounter: Payer: Self-pay | Admitting: Oncology

## 2022-01-16 ENCOUNTER — Inpatient Hospital Stay: Payer: Medicare HMO | Attending: Oncology | Admitting: Oncology

## 2022-01-16 VITALS — BP 176/78 | HR 96 | Temp 97.5°F | Wt 244.0 lb

## 2022-01-16 DIAGNOSIS — J9611 Chronic respiratory failure with hypoxia: Secondary | ICD-10-CM | POA: Insufficient documentation

## 2022-01-16 DIAGNOSIS — Z87891 Personal history of nicotine dependence: Secondary | ICD-10-CM | POA: Insufficient documentation

## 2022-01-16 DIAGNOSIS — Z853 Personal history of malignant neoplasm of breast: Secondary | ICD-10-CM | POA: Diagnosis not present

## 2022-01-16 DIAGNOSIS — C50412 Malignant neoplasm of upper-outer quadrant of left female breast: Secondary | ICD-10-CM | POA: Insufficient documentation

## 2022-01-16 DIAGNOSIS — J449 Chronic obstructive pulmonary disease, unspecified: Secondary | ICD-10-CM | POA: Insufficient documentation

## 2022-01-16 DIAGNOSIS — C773 Secondary and unspecified malignant neoplasm of axilla and upper limb lymph nodes: Secondary | ICD-10-CM | POA: Insufficient documentation

## 2022-01-16 DIAGNOSIS — Z170 Estrogen receptor positive status [ER+]: Secondary | ICD-10-CM | POA: Diagnosis not present

## 2022-01-16 DIAGNOSIS — M810 Age-related osteoporosis without current pathological fracture: Secondary | ICD-10-CM | POA: Diagnosis not present

## 2022-01-16 DIAGNOSIS — Z803 Family history of malignant neoplasm of breast: Secondary | ICD-10-CM | POA: Diagnosis not present

## 2022-01-16 DIAGNOSIS — Z08 Encounter for follow-up examination after completed treatment for malignant neoplasm: Secondary | ICD-10-CM | POA: Diagnosis not present

## 2022-01-16 DIAGNOSIS — Z79811 Long term (current) use of aromatase inhibitors: Secondary | ICD-10-CM | POA: Diagnosis not present

## 2022-01-16 DIAGNOSIS — Z510 Encounter for antineoplastic radiation therapy: Secondary | ICD-10-CM | POA: Diagnosis present

## 2022-01-16 NOTE — Progress Notes (Signed)
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  Single morphologically abnormal lymph node in the left axilla.  This was followed by a ultrasound and biopsy.  Left breast biopsy showed invasive mammary carcinoma grade 312 mm.  Lymph node biopsy was also positive for macro metastatic mammary carcinoma.  ER more than 90% positive PR 11 to 20% positive and HER2 negative.  Patient will be seeing Dr. Hampton Abbot later this week.   Patient had an abnormal mammogram which showed a possible distortion/small complicated cyst at the same position of the left breast back in 2019 and a repeat mammogram was recommended in 6 months.  However due to [place] pandemic and death of her son patient did not follow through.  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  She is yet to start taking her letrozole.  ECOG PS- 2 Pain scale- 3   Review of systems- Review of Systems  Constitutional:  Positive for malaise/fatigue. Negative for chills, fever and weight loss.  HENT:  Negative for congestion, ear discharge and nosebleeds.   Eyes:  Negative for blurred  vision.  Respiratory:  Positive for shortness of breath. Negative for cough, hemoptysis, sputum production and wheezing.   Cardiovascular:  Negative for chest pain, palpitations, orthopnea and claudication.  Gastrointestinal:  Negative for abdominal pain, blood in stool, constipation, diarrhea, heartburn, melena, nausea and vomiting.  Genitourinary:  Negative for dysuria, flank pain, frequency, hematuria and urgency.  Musculoskeletal:  Negative for back pain, joint pain and myalgias.  Skin:  Negative for rash.  Neurological:  Negative for dizziness, tingling, focal weakness, seizures, weakness and headaches.  Endo/Heme/Allergies:  Does not bruise/bleed easily.  Psychiatric/Behavioral:  Negative for depression and suicidal ideas. The patient does not have insomnia.       Allergies  Allergen Reactions   Latex Rash     Past Medical History:  Diagnosis Date   Acute hypoxemic respiratory failure (HCC)    Allergy    Asthma    Breast cancer ([place]) 2015   left breast cancer ADH LCIS FEA   COPD (chronic obstructive pulmonary disease) (HCC)    Hypokalemia    Hyponatremia    Obesity    Skin cancer    squammous cell carcinoma     Past Surgical History:  Procedure Laterality Date   AXILLARY SENTINEL NODE BIOPSY Left 08/29/2021   Procedure: AXILLARY SENTINEL NODE BIOPSY;  Surgeon: Olean Ree, MD;  Location: [facility] ORS;  Service: General;  Laterality: Left;   BREAST BIOPSY Left 2015   stereo biopsy   BREAST BIOPSY Left 07/28/2021   [place] Core Bx 2:30 6cmfn Heart clip, hydro clip in axilla, path pending  BREAST EXCISIONAL BIOPSY Left 2015   revealing ADH, FEA, LCIS   BREAST LUMPECTOMY Left 08/21/2013   ADH, FEA, LCIS   BREAST LUMPECTOMY WITH RADIOFREQUENCY TAG IDENTIFICATION Left 04/54/0981   x 2 2:30 mass and axilla   BREAST LUMPECTOMY WITH RADIOFREQUENCY TAG IDENTIFICATION Left 1/91/4782   Procedure: BREAST LUMPECTOMY WITH RADIOFREQUENCY TAG IDENTIFICATION;  Surgeon: Olean Ree,  MD;  Location: [facility] ORS;  Service: General;  Laterality: Left;   COLONOSCOPY WITH PROPOFOL N/A 12/24/2017   Procedure: COLONOSCOPY WITH PROPOFOL;  Surgeon: Jonathon Bellows, MD;  Location[facility] ENDOSCOPY;  Service: Gastroenterology;  Laterality: N/A;   MOHS SURGERY      Social History   Socioeconomic History   Marital status: Widowed    Spouse name: Not on file   Number of children: Not on file   Years of education: Not on file   Highest education level: Not on file  Occupational History   Not on file  Tobacco Use   Smoking status: Former    Packs/day: 1.00    Years: 20.00    Total pack years: 20.00    Types: Cigarettes    Quit date: 2010    Years since quitting: 13.9    Passive exposure: Past   Smokeless tobacco: Never  Vaping Use   Vaping Use: Never used  Substance and Sexual Activity   Alcohol use: Not Currently   Drug use: Never   Sexual activity: Not Currently  Other Topics Concern   Not on file  Social History Narrative   Not on file   Social Determinants of Health   Financial Resource Strain: Not on file  Food Insecurity: Not on file  Transportation Needs: Not on file  Physical Activity: Not on file  Stress: Not on file  Social Connections: Not on file  Intimate Partner Violence: Not on file    Family History  Problem Relation Age of Onset   Hypertension Mother    Cancer Father    Cancer Sister        Skin cancer   Breast cancer Maternal Grandmother    Breast cancer Cousin      Current Outpatient Medications:    acetaminophen (TYLENOL) 500 MG tablet, Take 2 tablets (1,000 mg total) by mouth every 6 (six) hours as needed for mild pain., Disp: , Rfl:    ADVAIR DISKUS 500-50 MCG/ACT AEPB, Inhale 1 puff into the lungs 2 (two) times daily., Disp: , Rfl:    albuterol (PROVENTIL HFA;VENTOLIN HFA) 108 (90 Base) MCG/ACT inhaler, Inhale 2 puffs into the lungs every 2 (two) hours as needed for wheezing or shortness of breath (cough)., Disp: 1 Inhaler, Rfl: 0    albuterol (PROVENTIL) (2.5 MG/3ML) 0.083% nebulizer solution, Take 2.5 mg by nebulization every 6 (six) hours as needed for wheezing or shortness of breath., Disp: , Rfl:    citalopram (CELEXA) 40 MG tablet, Take 40 mg by mouth at bedtime., Disp: , Rfl:    fluticasone (FLONASE) 50 MCG/ACT nasal spray, Place 2 sprays into both nostrils as needed., Disp: , Rfl:    lidocaine (LIDODERM) 5 %, Place 1 patch onto the skin daily. Remove & Discard patch within 12 hours or as directed by MD (Patient taking differently: Place 1 patch onto the skin as needed. Remove & Discard patch within 12 hours or as directed by MD), Disp: 30 patch, Rfl: 0   montelukast (SINGULAIR) 10 MG tablet, Take 1 tablet by mouth at bedtime., Disp: , Rfl:    OXYGEN, Inhale 1.5-2  L into the lungs as needed., Disp: , Rfl:    SPIRIVA HANDIHALER 18 MCG inhalation capsule, 1 capsule at bedtime., Disp: , Rfl:    letrozole (FEMARA) 2.5 MG tablet, Take 1 tablet (2.5 mg total) by mouth daily. (Patient not taking: Reported on 01/16/2022), Disp: 90 tablet, Rfl: 3  Physical exam:  Vitals:   01/16/22 1132  BP: (!) 176/78  Pulse: 96  Temp: (!) 97.5 F (36.4 C)  TempSrc: Tympanic  SpO2: 99%  Weight: 244 lb (110.7 kg)   Physical Exam Constitutional:      Comments: Ambulates with the help of her walker.  She is on home oxygen  Cardiovascular:     Rate and Rhythm: Normal rate and regular rhythm.     Heart sounds: Normal heart sounds.  Pulmonary:     Effort: Pulmonary effort is normal.     Breath sounds: Normal breath sounds.  Abdominal:     General: Bowel sounds are normal.     Palpations: Abdomen is soft.  Skin:    General: Skin is warm and dry.  Neurological:     Mental Status: She is alert and oriented to person, place, and time.         Latest Ref Rng & Units 08/18/2021   11:57 AM  CMP  Glucose 70 - 99 mg/dL 114   BUN 8 - 23 mg/dL 10   Creatinine 0.44 - 1.00 mg/dL 0.79   Sodium 135 - 145 mmol/L 139   Potassium 3.5 - 5.1  mmol/L 3.6   Chloride 98 - 111 mmol/L 109   CO2 22 - 32 mmol/L 25   Calcium 8.9 - 10.3 mg/dL 8.9       Latest Ref Rng & Units 11/29/2021    8:41 AM  CBC  WBC 4.0 - 10.5 K/uL 7.1   Hemoglobin 12.0 - 15.0 g/dL 13.5   Hematocrit 36.0 - 46.0 % 41.0   Platelets 150 - 400 K/uL 185     No images are attached to the encounter.  DG Bone Density  Result Date: 12/20/2021 EXAM: DUAL X-RAY ABSORPTIOMETRY (DXA) FOR BONE MINERAL DENSITY IMPRESSION: Your patient Sadeen Wiegel completed a BMD test on 12/20/2021 using the [employer] (software version: 14.10) manufactured by [employer]. The following summarizes the results of our evaluation. Technologist:VLM PATIENT BIOGRAPHICAL: Name: Anjannette, Gauger Patient ID: [id number] Birth Date: Dec 19, 1950 Height: 64.0 in. Gender: Female Exam Date: 12/20/2021 Weight: 243.0 lbs. Indications: Caucasian, COPD, History of Breast Cancer, Postmenopausal Fractures: Treatments: Advair Inhaler, Albuterol DENSITOMETRY RESULTS: Site      Region     Measured Date Measured Age WHO Classification Young Adult T-score BMD         %Change vs. Previous Significant Change (*) AP Spine L1-L4 12/20/2022 70.7 Osteopenia -1.7 0.986 g/cm2 -2.9% Yes AP Spine L1-L4 12/06/2016 65.7 Osteopenia -1.5 1.015 g/cm2 - - DualFemur Neck Right 12/20/2021 70.7 Osteoporosis -3.1 0.604 g/cm2 -15.0% Yes DualFemur Neck Right 12/06/2016 65.7 Osteopenia -2.4 0.711 g/cm2 - - DualFemur Total Mean 12/20/2021 70.7 Osteopenia -1.6 0.803 g/cm2 -13.0% Yes DualFemur Total Mean 12/06/2016 65.7 Normal -0.7 0.923 g/cm2 - - ASSESSMENT: The BMD measured at Femur Neck Right is 0.604 g/cm2 with a T-score of -3.1. This patient is considered osteoporotic according to [place] [facility]) criteria. Compared with prior study, there has been significant decrease in the spine. Compared with prior study, there has been significant decrease in the total hip. The scan quality is good. World Pharmacologist  (  WHO) criteria for post-menopausal, Caucasian Women: Normal:                   T-score at or above -1 SD Osteopenia/low bone mass: T-score between -1 and -2.5 SD Osteoporosis:             T-score at or below -2.5 SD RECOMMENDATIONS: 1. All patients should optimize calcium and vitamin D intake. 2. Consider FDA-approved medical therapies in postmenopausal women and men aged 41 years and older, based on the following: a. A hip or vertebral(clinical or morphometric) fracture b. T-score < -2.5 at the femoral neck or spine after appropriate evaluation to exclude secondary causes c. Low bone mass (T-score between -1.0 and -2.5 at the femoral neck or spine) and a 10-year probability of a hip fracture > 3% or a 10-year probability of a major osteoporosis-related fracture > 20% based on the [place]-adapted WHO algorithm 3. Clinician judgment and/or patient preferences may indicate treatment for people with 10-year fracture probabilities above or below these levels FOLLOW-UP: People with diagnosed cases of osteoporosis or at high risk for fracture should have regular bone mineral density tests. For patients eligible for Medicare, routine testing is allowed once every 2 years. The testing frequency can be increased to one year for patients who have rapidly progressing disease, those who are receiving or discontinuing medical therapy to restore bone mass, or have additional risk factors. I have reviewed this report, and agree with the above findings. Mark A. Thornton Papas, M.D. [facility] Electronically Signed   By: Lavonia Dana M.D.   On: 12/20/2021 10:20     Assessment and plan- Patient is a 72 y.o. 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  She has not started her letrozole yet.  Oncotype score came back at 18 and  therefore she did not require any adjuvant chemotherapy.  Her baseline bone density scan does show osteoporosis involving the right femur neck.  With regards to osteoporosis management we have 2 options Weekly Fosamax-side effects include reflux issues and therefore patient needs to take it in an upright position in an empty stomach and cannot lay down for an hour after taking the pill  Parenteral bisphosphonates such as Prolia or yearly Reclast which can be associated with potential side effects such as osteonecrosis of the jaw and requires dental clearance prior Patient is willing to try Fosamax at this time.  Options for breast cancer endocrine therapy include Starting aromatase inhibitor such as letrozole as long as she is doing something more in terms of bisphosphonates for her osteoporosis If patient decides not to take anything for osteoporosis and tamoxifen is an option since it does not have any further bone depleting side effects.  However tamoxifen is associated with potential side effects such as cataracts, risk of DVTs and uterine cancer.  Patient is willing to try letrozole along with Fosamax at this time.  I will see her back in 3 months no labs   Visit Diagnosis 1. Osteoporosis of femur without pathological fracture   2. Encounter for follow-up surveillance of breast cancer      Dr. Randa Evens, MD, MPH [facility] at [facility] [phone] 01/16/2022 2:43 PM

## 2022-01-17 MED ORDER — ALENDRONATE SODIUM 70 MG PO TABS: 70.0000 mg | ORAL_TABLET | ORAL | 6 refills | Status: DC

## 2022-01-17 NOTE — Addendum Note (Signed)
Addended by: Luella Cook on: 01/17/2022 11:09 AM   Modules accepted: Orders

## 2022-01-18 ENCOUNTER — Ambulatory Visit: Payer: Medicare HMO | Attending: Radiation Oncology | Admitting: Radiation Oncology

## 2022-01-18 ENCOUNTER — Other Ambulatory Visit: Payer: Self-pay | Admitting: *Deleted

## 2022-01-18 MED ORDER — ALENDRONATE SODIUM 70 MG PO TABS: 70.0000 mg | ORAL_TABLET | ORAL | 6 refills | Status: DC

## 2022-03-05 ENCOUNTER — Ambulatory Visit: Payer: Medicare HMO | Admitting: Oncology

## 2022-04-25 ENCOUNTER — Inpatient Hospital Stay: Payer: Medicare HMO | Admitting: Oncology

## 2022-08-25 ENCOUNTER — Other Ambulatory Visit: Payer: Self-pay | Admitting: Oncology

## 2022-09-24 ENCOUNTER — Other Ambulatory Visit: Payer: Self-pay | Admitting: Oncology

## 2022-10-16 ENCOUNTER — Telehealth: Payer: Self-pay | Admitting: Oncology

## 2022-10-16 NOTE — Telephone Encounter (Signed)
Patient called to schedule appointment with Dr. Smith Robert- last appointment cancelled 04/25/22- please advise scheduling  thanks

## 2022-10-18 ENCOUNTER — Other Ambulatory Visit: Payer: Self-pay | Admitting: Family Medicine

## 2022-10-18 DIAGNOSIS — M8580 Other specified disorders of bone density and structure, unspecified site: Secondary | ICD-10-CM

## 2022-10-19 ENCOUNTER — Other Ambulatory Visit: Payer: Self-pay | Admitting: Oncology

## 2022-10-22 ENCOUNTER — Other Ambulatory Visit: Payer: Self-pay | Admitting: *Deleted

## 2022-10-22 ENCOUNTER — Encounter: Payer: Self-pay | Admitting: Oncology

## 2022-10-22 ENCOUNTER — Inpatient Hospital Stay: Payer: Medicare HMO | Attending: Oncology | Admitting: Oncology

## 2022-10-22 VITALS — BP 122/67 | HR 85 | Temp 96.8°F | Resp 18 | Ht 63.0 in | Wt 247.4 lb

## 2022-10-22 DIAGNOSIS — C50412 Malignant neoplasm of upper-outer quadrant of left female breast: Secondary | ICD-10-CM | POA: Diagnosis present

## 2022-10-22 DIAGNOSIS — Z803 Family history of malignant neoplasm of breast: Secondary | ICD-10-CM | POA: Insufficient documentation

## 2022-10-22 DIAGNOSIS — Z808 Family history of malignant neoplasm of other organs or systems: Secondary | ICD-10-CM | POA: Diagnosis not present

## 2022-10-22 DIAGNOSIS — Z853 Personal history of malignant neoplasm of breast: Secondary | ICD-10-CM | POA: Diagnosis not present

## 2022-10-22 DIAGNOSIS — J4489 Other specified chronic obstructive pulmonary disease: Secondary | ICD-10-CM | POA: Diagnosis not present

## 2022-10-22 DIAGNOSIS — Z79811 Long term (current) use of aromatase inhibitors: Secondary | ICD-10-CM | POA: Diagnosis not present

## 2022-10-22 DIAGNOSIS — Z170 Estrogen receptor positive status [ER+]: Secondary | ICD-10-CM | POA: Insufficient documentation

## 2022-10-22 DIAGNOSIS — Z87891 Personal history of nicotine dependence: Secondary | ICD-10-CM | POA: Insufficient documentation

## 2022-10-22 DIAGNOSIS — C773 Secondary and unspecified malignant neoplasm of axilla and upper limb lymph nodes: Secondary | ICD-10-CM | POA: Diagnosis not present

## 2022-10-22 DIAGNOSIS — Z923 Personal history of irradiation: Secondary | ICD-10-CM | POA: Diagnosis not present

## 2022-10-22 DIAGNOSIS — Z08 Encounter for follow-up examination after completed treatment for malignant neoplasm: Secondary | ICD-10-CM

## 2022-10-22 DIAGNOSIS — J9611 Chronic respiratory failure with hypoxia: Secondary | ICD-10-CM | POA: Diagnosis not present

## 2022-10-22 DIAGNOSIS — Z7983 Long term (current) use of bisphosphonates: Secondary | ICD-10-CM | POA: Diagnosis not present

## 2022-10-22 NOTE — Progress Notes (Signed)
Orders for mammogram

## 2022-10-22 NOTE — Progress Notes (Signed)
Hematology/Oncology Consult note 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  Single morphologically abnormal lymph node in the left axilla.  This was followed by a ultrasound and biopsy.  Left breast biopsy showed invasive mammary carcinoma grade 312 mm.  Lymph node biopsy was also positive for macro metastatic mammary carcinoma.  ER more than 90% positive PR 11 to 20% positive and HER2 negative.  Final lumpectomy pathology from 07/28/2021 showed 18 mm grade 3 invasive mammary carcinoma ER greater than 90% positive, PR 11 to 20% positive and HER2 -1 lymph node involved with metastatic carcinoma 13 mm with extracapsular extension. Oncotype score came back at 18 and absolute benefit of chemotherapy less than 1%.  Patient completedAdjuvant radiation therapy which was stopped a little prematurely due to radiation dermatitis.  She was started on letrozole sometime in December 2023.  Patient has chronic hypoxic respiratory failure secondary to COPD  Interval history-patient is tolerating letrozole well without any significant side effects.  She is also on weekly Fosamax along with calcium and vitamin D.  Denies any breast concerns  ECOG PS- 2 Pain scale- 0   Review of systems- Review of Systems  Constitutional:  Positive for  malaise/fatigue. Negative for chills, fever and weight loss.  HENT:  Negative for congestion, ear discharge and nosebleeds.   Eyes:  Negative for blurred vision.  Respiratory:  Negative for cough, hemoptysis, sputum production, shortness of breath and wheezing.   Cardiovascular:  Negative for chest pain, palpitations, orthopnea and claudication.  Gastrointestinal:  Negative for abdominal pain, blood in stool, constipation, diarrhea, heartburn, melena, nausea and vomiting.  Genitourinary:  Negative for dysuria, flank pain, frequency, hematuria and urgency.  Musculoskeletal:  Negative for back pain, joint pain and myalgias.  Skin:  Negative for rash.  Neurological:  Negative for dizziness, tingling, focal weakness, seizures, weakness and headaches.  Endo/Heme/Allergies:  Does not bruise/bleed easily.  Psychiatric/Behavioral:  Negative for depression and suicidal ideas. The patient does not have insomnia.       Allergies  Allergen Reactions   Latex Rash     Past Medical History:  Diagnosis Date   Acute hypoxemic respiratory failure (HCC)    Allergy    Asthma    Breast cancer (HCC) 2015   left breast cancer ADH LCIS FEA   COPD (chronic obstructive pulmonary disease) (HCC)    Hypokalemia    Hyponatremia    Obesity    Skin cancer    squammous cell carcinoma     Past Surgical History:  Procedure Laterality Date   AXILLARY SENTINEL NODE BIOPSY Left 08/29/2021   Procedure: AXILLARY SENTINEL NODE BIOPSY;  Surgeon: Henrene Dodge, MD;  Location: [facility] ORS;  Service: General;  Laterality: Left;   BREAST BIOPSY Left 2015   stereo biopsy   BREAST BIOPSY Left 07/28/2021   [place]  Core Bx 2:30 6cmfn Heart clip, hydro clip in axilla, path pending   BREAST EXCISIONAL BIOPSY Left 2015   revealing ADH, FEA, LCIS   BREAST LUMPECTOMY Left 08/21/2013   ADH, FEA, LCIS   BREAST LUMPECTOMY WITH RADIOFREQUENCY TAG IDENTIFICATION Left 08/22/2021   x 2 2:30 mass and axilla   BREAST LUMPECTOMY WITH  RADIOFREQUENCY TAG IDENTIFICATION Left 08/29/2021   Procedure: BREAST LUMPECTOMY WITH RADIOFREQUENCY TAG IDENTIFICATION;  Surgeon: Henrene Dodge, MD;  Location: [facility] ORS;  Service: General;  Laterality: Left;   COLONOSCOPY WITH PROPOFOL N/A 12/24/2017   Procedure: COLONOSCOPY WITH PROPOFOL;  Surgeon: Wyline Mood, MD;  Location: [facility] ENDOSCOPY;  Service: Gastroenterology;  Laterality: N/A;   MOHS SURGERY      Social History   Socioeconomic History   Marital status: Widowed    Spouse name: Not on file   Number of children: Not on file   Years of education: Not on file   Highest education level: Not on file  Occupational History   Not on file  Tobacco Use   Smoking status: Former    Current packs/day: 0.00    Average packs/day: 1 pack/day for 20.0 years (20.0 ttl pk-yrs)    Types: Cigarettes    Start date: 65    Quit date: 2010    Years since quitting: 14.7    Passive exposure: Past   Smokeless tobacco: Never  Vaping Use   Vaping status: Never Used  Substance and Sexual Activity   Alcohol use: Not Currently   Drug use: Never   Sexual activity: Not Currently  Other Topics Concern   Not on file  Social History Narrative   Not on file   Social Determinants of Health   Financial Resource Strain: Not on file  Food Insecurity: Not on file  Transportation Needs: Not on file  Physical Activity: Not on file  Stress: Not on file  Social Connections: Not on file  Intimate Partner Violence: Not on file    Family History  Problem Relation Age of Onset   Hypertension Mother    Cancer Father    Cancer Sister        Skin cancer   Breast cancer Maternal Grandmother    Breast cancer Cousin      Current Outpatient Medications:    acetaminophen (TYLENOL) 500 MG tablet, Take 2 tablets (1,000 mg total) by mouth every 6 (six) hours as needed for mild pain., Disp: , Rfl:    ADVAIR DISKUS 500-50 MCG/ACT AEPB, Inhale 1 puff into the lungs 2 (two) times daily., Disp: , Rfl:     albuterol (PROVENTIL HFA;VENTOLIN HFA) 108 (90 Base) MCG/ACT inhaler, Inhale 2 puffs into the lungs every 2 (two) hours as needed for wheezing or shortness of breath (cough)., Disp: 1 Inhaler, Rfl: 0   albuterol (PROVENTIL) (2.5 MG/3ML) 0.083% nebulizer solution, Take 2.5 mg by nebulization every 6 (six) hours as needed for wheezing or shortness of breath., Disp: , Rfl:    alendronate (FOSAMAX) 70 MG tablet, TAKE 1 TABLET ONE TIME WEEKLY. TAKE WITH A FULL GLASS OF WATER, Disp: 12 tablet, Rfl: 3   buPROPion (WELLBUTRIN XL) 150 MG 24 hr tablet, Take 150 mg by mouth daily., Disp: , Rfl:    citalopram (CELEXA) 40 MG tablet, Take 40 mg by mouth at bedtime., Disp: , Rfl:    fluticasone (FLONASE) 50 MCG/ACT nasal spray, Place 2 sprays into both nostrils as needed., Disp: , Rfl:    hydrOXYzine (ATARAX) 50 MG tablet, Take 50  mg by mouth at bedtime., Disp: , Rfl:    lidocaine (LIDODERM) 5 %, Place 1 patch onto the skin daily. Remove & Discard patch within 12 hours or as directed by MD (Patient taking differently: Place 1 patch onto the skin as needed. Remove & Discard patch within 12 hours or as directed by MD), Disp: 30 patch, Rfl: 0   montelukast (SINGULAIR) 10 MG tablet, Take 1 tablet by mouth at bedtime., Disp: , Rfl:    OXYGEN, Inhale 1.5-2 L into the lungs as needed., Disp: , Rfl:    SPIRIVA HANDIHALER 18 MCG inhalation capsule, 1 capsule at bedtime., Disp: , Rfl:    letrozole (FEMARA) 2.5 MG tablet, Take 1 tablet (2.5 mg total) by mouth daily. (Patient not taking: Reported on 01/16/2022), Disp: 90 tablet, Rfl: 3  Physical exam:  Vitals:   10/22/22 1156  BP: 122/67  Pulse: 85  Resp: 18  Temp: (!) 96.8 F (36 C)  TempSrc: Tympanic  SpO2: 97%  Weight: 247 lb 6.4 oz (112.2 kg)  Height: 5\' 3"  (1.6 m)   Physical Exam Cardiovascular:     Rate and Rhythm: Normal rate and regular rhythm.     Heart sounds: Normal heart sounds.  Pulmonary:     Effort: Pulmonary effort is normal.     Breath sounds:  Normal breath sounds.  Skin:    General: Skin is warm and dry.  Neurological:     Mental Status: She is alert and oriented to person, place, and time.    Breast exam was performed in seated and lying down position. Patient is status post left lumpectomy with a well-healed surgical scar. No evidence of any palpable masses. No evidence of axillary adenopathy. No evidence of any palpable masses or lumps in the right breast. No evidence of right axillary adenopathy      Latest Ref Rng & Units 08/18/2021   11:57 AM  CMP  Glucose 70 - 99 mg/dL 098   BUN 8 - 23 mg/dL 10   Creatinine 1.19 - 1.00 mg/dL 1.47   Sodium 829 - 562 mmol/L 139   Potassium 3.5 - 5.1 mmol/L 3.6   Chloride 98 - 111 mmol/L 109   CO2 22 - 32 mmol/L 25   Calcium 8.9 - 10.3 mg/dL 8.9       Latest Ref Rng & Units 11/29/2021    8:41 AM  CBC  WBC 4.0 - 10.5 K/uL 7.1   Hemoglobin 12.0 - 15.0 g/dL 13.0   Hematocrit 86.5 - 46.0 % 41.0   Platelets 150 - 400 K/uL 185      Assessment and plan- Patient is a 72 y.o. female  with pathological prognostic stage Ia invasive mammary carcinoma of the left breast pT1 cN1a M0 ER/PR positive HER2 negative.  She is here for a routine follow-up visit  Clinically patient is doing well with noConcerning signs and symptoms of recurrence based on today's exam.  She is overdue for a mammogram which will need to be scheduled at this time.  She will continue weekly Fosamax for her osteoporosis along with calcium and vitamin D.  She will also continue letrozole for 5 years ending sometime in December 2028.  I will see her back in 6 months no labs   Visit Diagnosis 1. Encounter for follow-up surveillance of breast cancer   2. Use of letrozole (Femara)   3. Long term use of alendronate therapy (Fosamax)      Dr. Owens Shark, MD, MPH [facility] at [facility]  [facility] [phone] 10/22/2022 2:35 PM

## 2022-10-31 ENCOUNTER — Inpatient Hospital Stay: Admission: RE | Admit: 2022-10-31 | Payer: Medicare HMO | Source: Ambulatory Visit

## 2022-10-31 ENCOUNTER — Other Ambulatory Visit: Payer: Medicare HMO

## 2022-11-12 ENCOUNTER — Ambulatory Visit
Admission: RE | Admit: 2022-11-12 | Discharge: 2022-11-12 | Disposition: A | Discharge status: Home / Self Care | Payer: Medicare HMO | Source: Ambulatory Visit | Attending: Oncology | Admitting: Oncology

## 2022-11-12 DIAGNOSIS — C50412 Malignant neoplasm of upper-outer quadrant of left female breast: Secondary | ICD-10-CM | POA: Insufficient documentation

## 2022-11-12 DIAGNOSIS — Z171 Estrogen receptor negative status [ER-]: Secondary | ICD-10-CM

## 2022-12-01 IMAGING — MG DIGITAL DIAGNOSTIC BILAT W/ TOMO W/ CAD
8 series · 8 of 24 positions shown · non-contrast
Comparison: Previous exam(s).

CLINICAL DATA: 71-year-old female presenting for annual bilateral
mammogram and delayed follow-up of the left breast.

EXAM:
DIGITAL DIAGNOSTIC BILATERAL MAMMOGRAM WITH TOMOSYNTHESIS AND CAD;
ULTRASOUND LEFT BREAST LIMITED
TECHNIQUE: Bilateral digital diagnostic mammography and breast tomosynthesis
was performed. The images were evaluated with computer-aided
detection.; Targeted ultrasound examination of the left breast was
performed.

[L CC synth-2D]
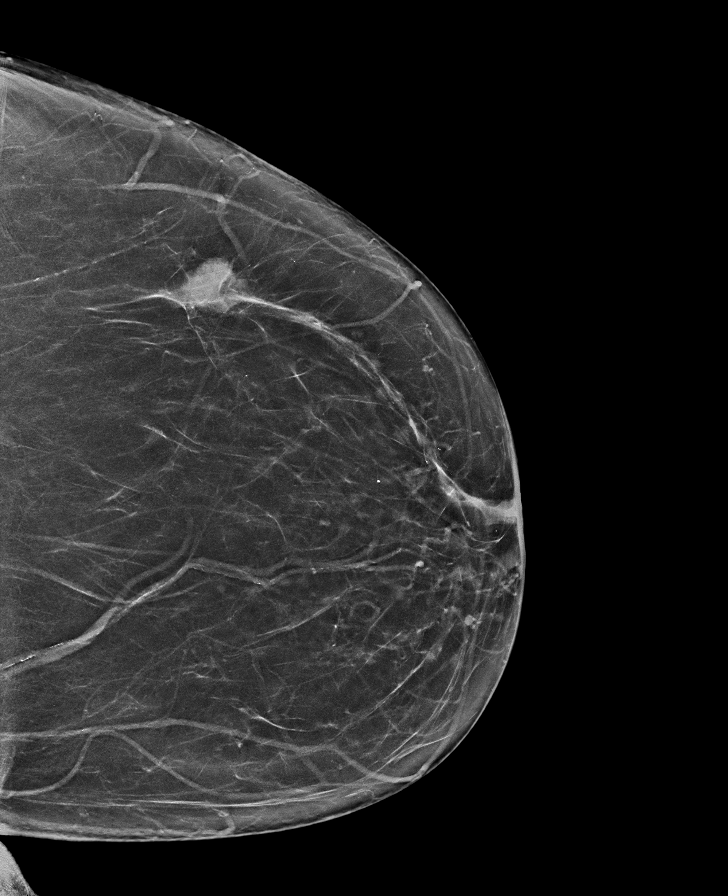

[R MLO synth-2D]
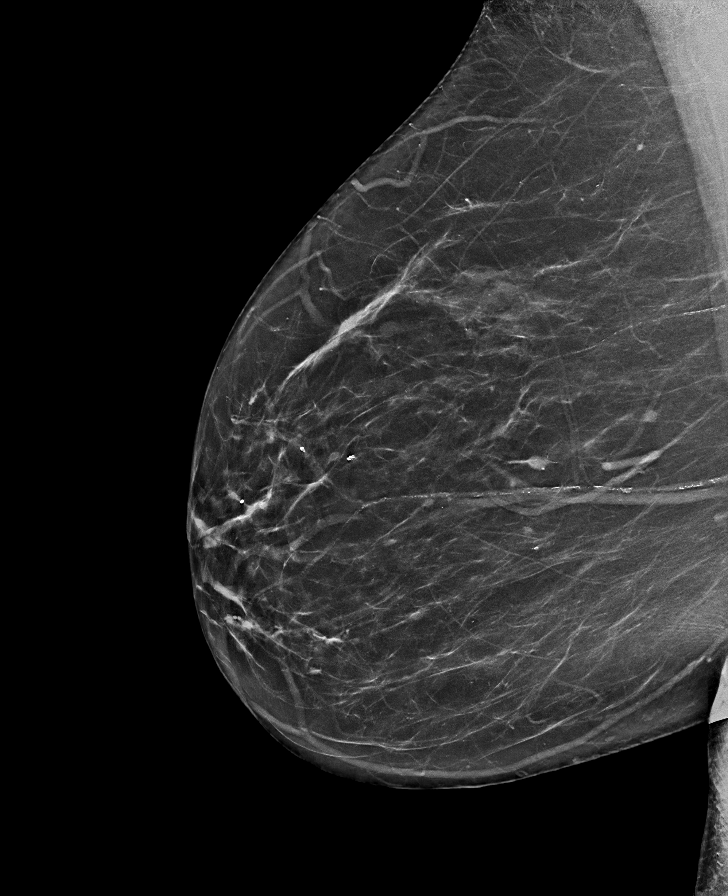

[L MLO synth-2D]
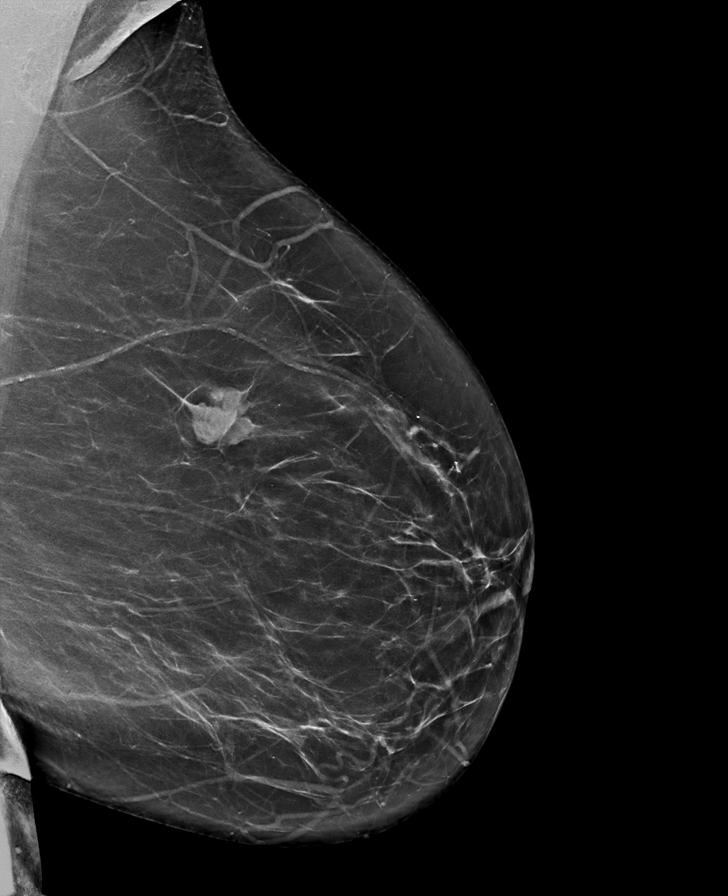

[R CC synth-2D]
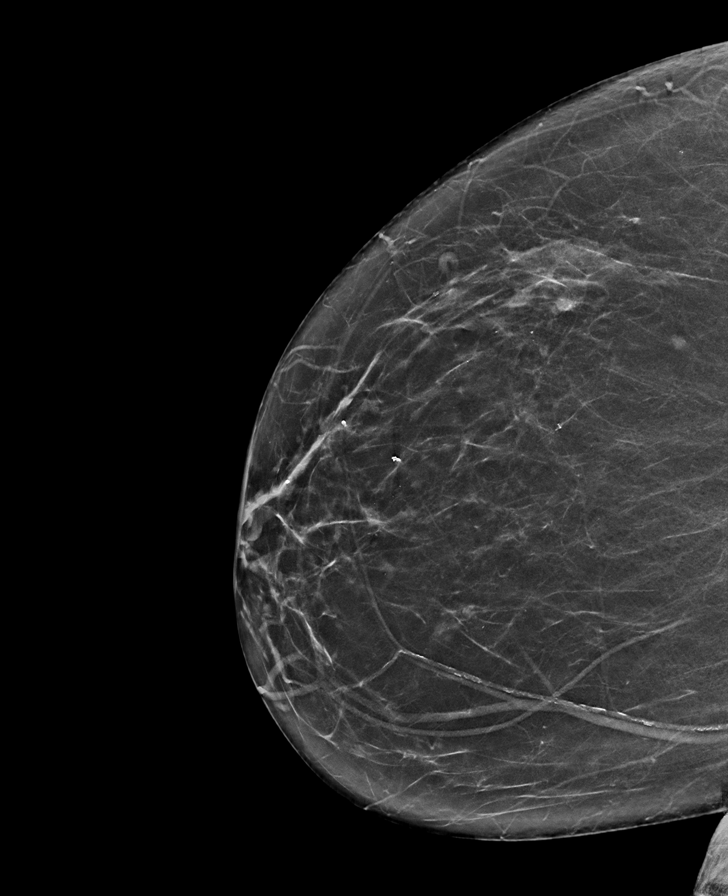

[L MLO tomo · tomo slice 41/81.0]
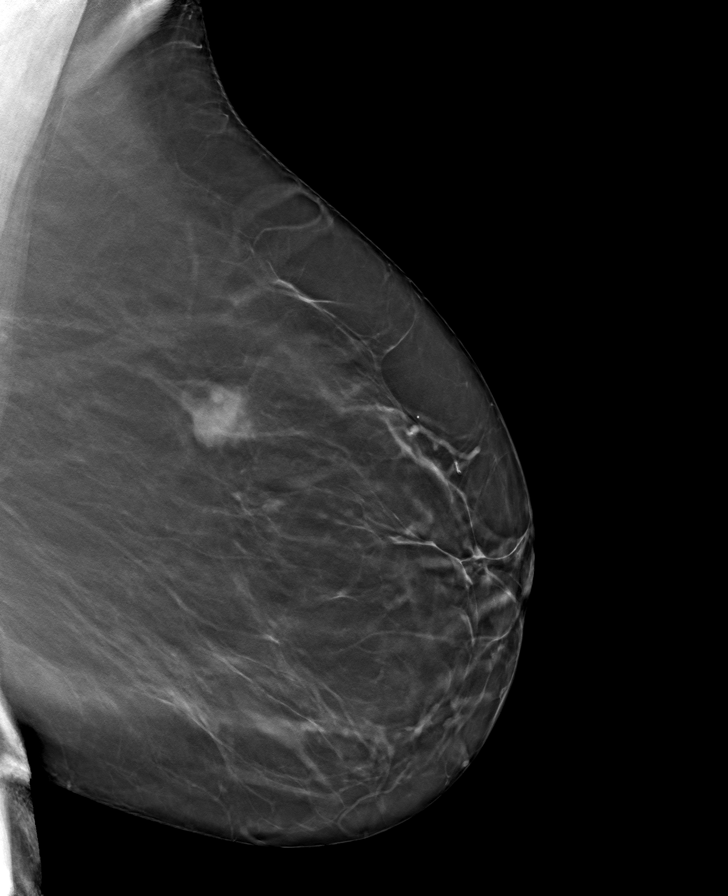

[R MLO tomo · tomo slice 38/75.0]
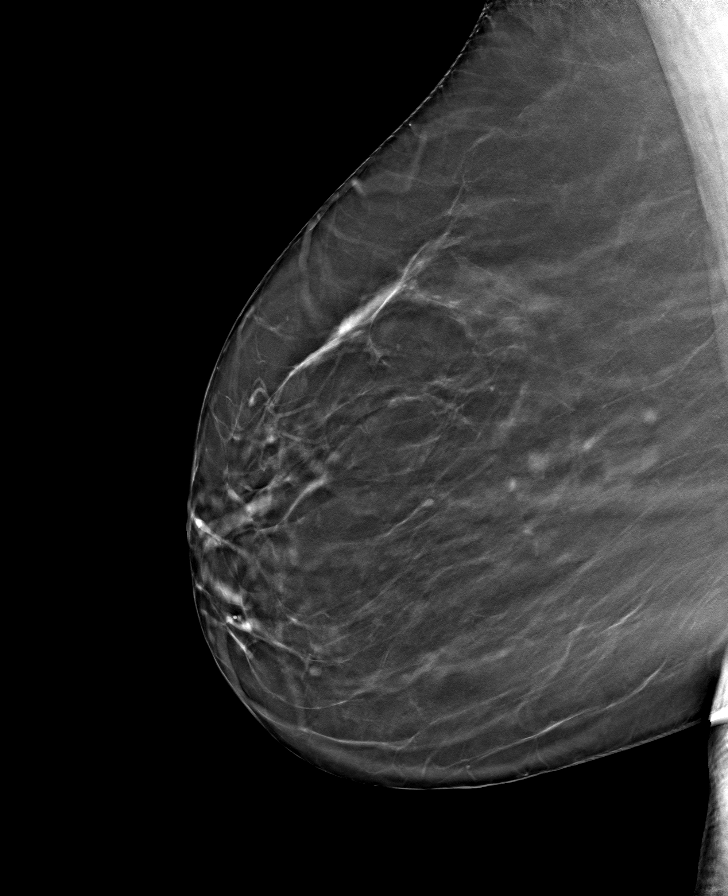

[R CC tomo · tomo slice 35/68.0]
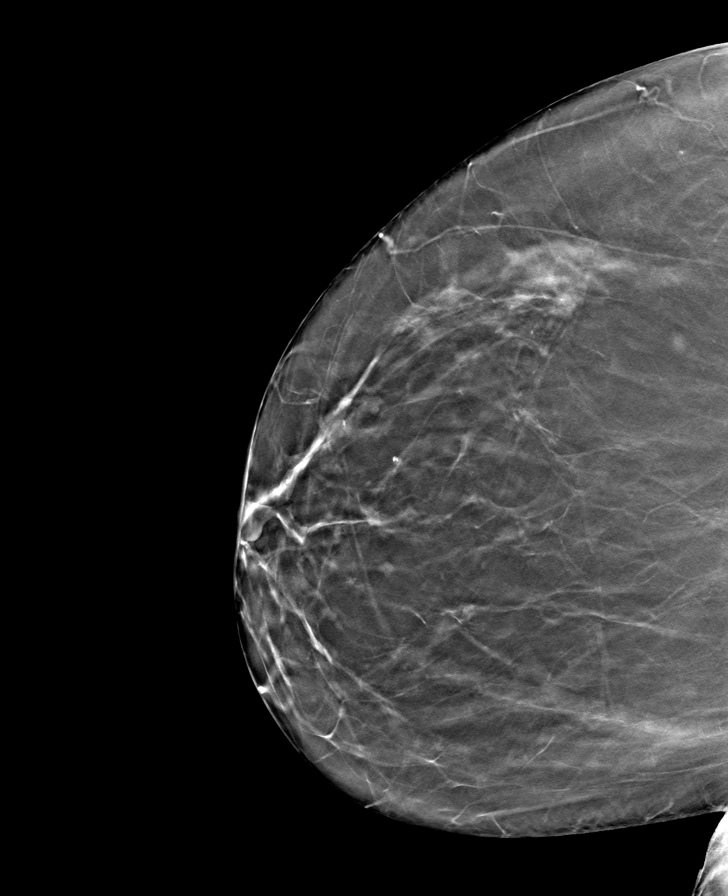

[L CC tomo · tomo slice 38/75.0]
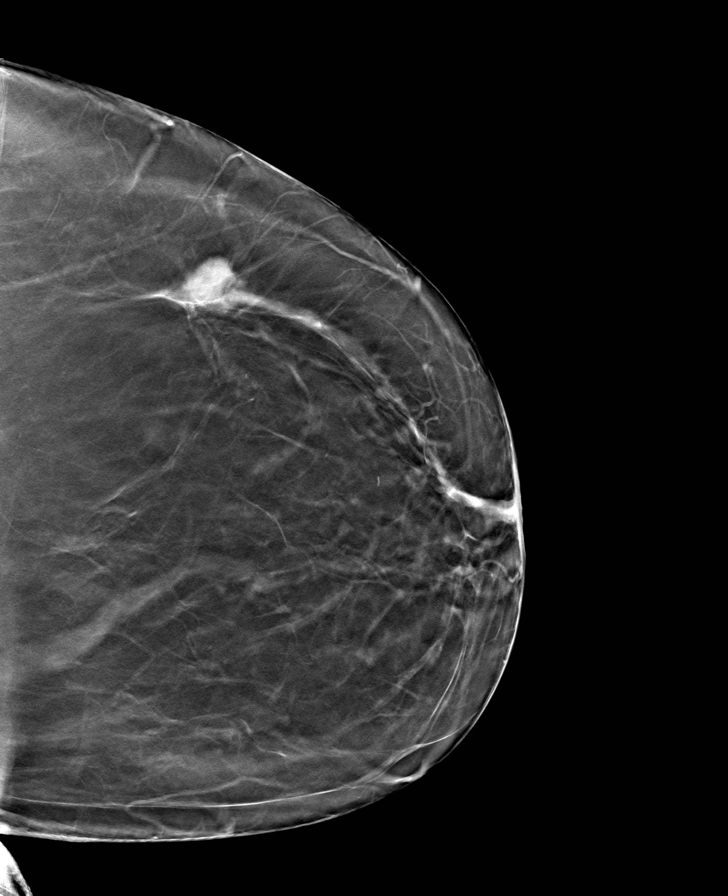

[8 of 24 positions shown; findings below may reference images not displayed]

ACR Breast Density Category b: There are scattered areas of
fibroglandular density.
FINDINGS: An irregular, spiculated hyperdense mass is demonstrated in the
upper outer left breast at posterior depth. Otherwise, no new or
suspicious findings in either breast. The remainder of the
parenchymal pattern is stable.

Targeted ultrasound is performed, showing an irregular, hypoechoic
mass with associated vascularity at the [DATE] position 6 cm from the
nipple. It measures 1.8 x 1.4 x 1.7 cm. This correlates with the
mammographic finding. A single morphologically abnormal lymph node
is noted in the left axilla. It demonstrates 6 mm of diffuse
cortical thickening.
IMPRESSION: 1. Highly suspicious left breast mass at the [DATE] position.
Recommendation is for ultrasound-guided biopsy.
2. Suspicious left axillary lymphadenopathy. Recommendation is for
ultrasound-guided biopsy.
3. No mammographic evidence of malignancy on the right.

RECOMMENDATION:
Two area ultrasound-guided biopsy of the left breast and axilla.

I have discussed the findings and recommendations with the patient.
If applicable, a reminder letter will be sent to the patient
regarding the next appointment.

BI-RADS CATEGORY  5: Highly suggestive of malignancy.

## 2022-12-06 ENCOUNTER — Ambulatory Visit: Payer: Medicare HMO | Admitting: Gastroenterology

## 2022-12-06 NOTE — Progress Notes (Deleted)
Wyline Mood MD, MRCP(U.K) [address]  [address]  [place], [place] [address]  Main: [phone]  Fax: [phone]   Gastroenterology Consultation  Referring Provider:     Leanna Sato, MD Primary Care Physician:  Leanna Sato, MD Primary Gastroenterologist:  Dr. Wyline Mood  Reason for Consultation:     dysphagia and colon cancer screening         HPI:   Marie Fernandez is a 72 y.o. y/o female referred for consultation & management  by Dr. Marvis Moeller, Doralee Albino, MD.  ***  Past Medical History:  Diagnosis Date   Acute hypoxemic respiratory failure (HCC)    Allergy    Asthma    Breast cancer (HCC) 2015   left breast cancer ADH LCIS FEA   COPD (chronic obstructive pulmonary disease) (HCC)    Hypokalemia    Hyponatremia    Obesity    Personal history of radiation therapy    Skin cancer    squammous cell carcinoma    Past Surgical History:  Procedure Laterality Date   AXILLARY SENTINEL NODE BIOPSY Left 08/29/2021   Procedure: AXILLARY SENTINEL NODE BIOPSY;  Surgeon: Henrene Dodge, MD;  Location: [facility] ORS;  Service: General;  Laterality: Left;   BREAST BIOPSY Left 2015   stereo biopsy   BREAST BIOPSY Left 07/28/2021   [place] Core Bx 2:30 6cmfn Heart clip, hydro clip in axilla, positive   BREAST EXCISIONAL BIOPSY Left 2015   revealing ADH, FEA, LCIS   BREAST LUMPECTOMY Left 08/21/2013   ADH, FEA, LCIS   BREAST LUMPECTOMY WITH RADIOFREQUENCY TAG IDENTIFICATION Left 08/22/2021   x 2 2:30 mass and axilla   BREAST LUMPECTOMY WITH RADIOFREQUENCY TAG IDENTIFICATION Left 08/29/2021   Procedure: BREAST LUMPECTOMY WITH RADIOFREQUENCY TAG IDENTIFICATION;  Surgeon: Henrene Dodge, MD;  Location: [facility] ORS;  Service: General;  Laterality: Left;   COLONOSCOPY WITH PROPOFOL N/A 12/24/2017   Procedure: COLONOSCOPY WITH PROPOFOL;  Surgeon: Wyline Mood, MD;  Location: [facility];  Service: Gastroenterology;  Laterality: N/A;   MOHS SURGERY      Prior to Admission medications    Medication Sig Start Date End Date Taking? Authorizing Provider  acetaminophen (TYLENOL) 500 MG tablet Take 2 tablets (1,000 mg total) by mouth every 6 (six) hours as needed for mild pain. 08/29/21   Piscoya, Elita Quick, MD  ADVAIR DISKUS 500-50 MCG/ACT AEPB Inhale 1 puff into the lungs 2 (two) times daily. 11/28/20   [provider]  albuterol (PROVENTIL HFA;VENTOLIN HFA) 108 (90 Base) MCG/ACT inhaler Inhale 2 puffs into the lungs every 2 (two) hours as needed for wheezing or shortness of breath (cough). 11/13/17   Charlynne Pander, MD  albuterol (PROVENTIL) (2.5 MG/3ML) 0.083% nebulizer solution Take 2.5 mg by nebulization every 6 (six) hours as needed for wheezing or shortness of breath.    [provider]  alendronate (FOSAMAX) 70 MG tablet TAKE 1 TABLET ONE TIME WEEKLY. TAKE WITH A FULL GLASS OF WATER 10/19/22   Creig Hines, MD  buPROPion (WELLBUTRIN XL) 150 MG 24 hr tablet Take 150 mg by mouth daily. 10/19/22   [provider]  citalopram (CELEXA) 40 MG tablet Take 40 mg by mouth at bedtime. 05/20/20   [provider]  fluticasone (FLONASE) 50 MCG/ACT nasal spray Place 2 sprays into both nostrils as needed. 07/06/21   [provider]  hydrOXYzine (ATARAX) 50 MG tablet Take 50 mg by mouth at bedtime. 10/17/22   [provider]  letrozole (  FEMARA) 2.5 MG tablet Take 1 tablet (2.5 mg total) by mouth daily. Patient not taking: Reported on 01/16/2022 11/03/21   Creig Hines, MD  lidocaine (LIDODERM) 5 % Place 1 patch onto the skin daily. Remove & Discard patch within 12 hours or as directed by MD Patient taking differently: Place 1 patch onto the skin as needed. Remove & Discard patch within 12 hours or as directed by MD 01/08/21   Marrion Coy, MD  montelukast (SINGULAIR) 10 MG tablet Take 1 tablet by mouth at bedtime. 01/04/20   [provider]  OXYGEN Inhale 1.5-2 L into the lungs as needed.    [provider]  SPIRIVA HANDIHALER 18  MCG inhalation capsule 1 capsule at bedtime. 07/06/21   [provider]    Family History  Problem Relation Age of Onset   Hypertension Mother    Cancer Father    Cancer Sister        Skin cancer   Breast cancer Maternal Grandmother    Breast cancer Cousin      Social History   Tobacco Use   Smoking status: Former    Current packs/day: 0.00    Average packs/day: 1 pack/day for 20.0 years (20.0 ttl pk-yrs)    Types: Cigarettes    Start date: 21    Quit date: 2010    Years since quitting: 14.8    Passive exposure: Past   Smokeless tobacco: Never  Vaping Use   Vaping status: Never Used  Substance Use Topics   Alcohol use: Not Currently   Drug use: Never    Allergies as of 12/06/2022 - Review Complete 10/22/2022  Allergen Reaction Noted   Latex Rash 12/24/2017    Review of Systems:    All systems reviewed and negative except where noted in HPI.   Physical Exam:  There were no vitals taken for this visit. No LMP recorded. Patient is postmenopausal. Psych:  Alert and cooperative. Normal mood and affect. General:   Alert,  Well-developed, well-nourished, pleasant and cooperative in NAD Head:  Normocephalic and atraumatic. Eyes:  Sclera clear, no icterus.   Conjunctiva pink. Ears:  Normal auditory acuity. Neck:  Supple; no masses or thyromegaly. Lungs:  Respirations even and unlabored.  Clear throughout to auscultation.   No wheezes, crackles, or rhonchi. No acute distress. Heart:  Regular rate and rhythm; no murmurs, clicks, rubs, or gallops. Abdomen:  Normal bowel sounds.  No bruits.  Soft, non-tender and non-distended without masses, hepatosplenomegaly or hernias noted.  No guarding or rebound tenderness.    Neurologic:  Alert and oriented x3;  grossly normal neurologically. Psych:  Alert and cooperative. Normal mood and affect.  Imaging Studies: MM DIAG BREAST TOMO BILATERAL  Result Date: 11/12/2022 CLINICAL DATA:  History of left breast cancer status  post lumpectomy in July 2023 with adjuvant radiation. Patient is due for annual exam. No breast complaints today. EXAM: DIGITAL DIAGNOSTIC BILATERAL MAMMOGRAM WITH TOMOSYNTHESIS AND CAD TECHNIQUE: Bilateral digital diagnostic mammography and breast tomosynthesis was performed. The images were evaluated with computer-aided detection. COMPARISON:  Previous exam(s). ACR Breast Density Category b: There are scattered areas of fibroglandular density. FINDINGS: Interval postoperative changes in the upper outer left breast at posterior depth. Diffuse skin and trabecular thickening in the left breast, consistent with postradiation treatment changes. No new suspicious mass, calcifications other findings in either breast. IMPRESSION: Interval postoperative and postradiation treatment changes in the left breast. No mammographic findings of malignancy in either breast. RECOMMENDATION: Bilateral diagnostic mammogram  in 1 year. I have discussed the findings and recommendations with the patient. If applicable, a reminder letter will be sent to the patient regarding the next appointment. BI-RADS CATEGORY  2: Benign. Electronically Signed   By: Sherron Ales M.D.   On: 11/12/2022 15:34    Assessment and Plan:   Marie Fernandez is a 72 y.o. y/o female has been referred for dysphagia and colon cancer screening .    Plan   EGD+colonoscopy   Follow up in ***  Dr Wyline Mood MD,MRCP(U.K)    BP check ***

## 2023-04-22 ENCOUNTER — Encounter: Payer: Self-pay | Admitting: Oncology

## 2023-04-22 ENCOUNTER — Inpatient Hospital Stay: Payer: Medicare HMO | Attending: Oncology | Admitting: Oncology

## 2023-09-20 ENCOUNTER — Other Ambulatory Visit: Payer: Self-pay | Admitting: Oncology
